# Patient Record
Sex: Female | Born: 1975 | Race: Black or African American | Hispanic: No | Marital: Married | Smoking: Never smoker
Health system: Southern US, Community
[De-identification: ages and names within clinical notes are randomized; demographics above are authoritative.]

## PROBLEM LIST (undated history)

## (undated) DIAGNOSIS — K5900 Constipation, unspecified: Secondary | ICD-10-CM

## (undated) DIAGNOSIS — K219 Gastro-esophageal reflux disease without esophagitis: Secondary | ICD-10-CM

## (undated) DIAGNOSIS — N289 Disorder of kidney and ureter, unspecified: Secondary | ICD-10-CM

## (undated) DIAGNOSIS — J45909 Unspecified asthma, uncomplicated: Secondary | ICD-10-CM

## (undated) DIAGNOSIS — R519 Headache, unspecified: Secondary | ICD-10-CM

## (undated) DIAGNOSIS — R51 Headache: Secondary | ICD-10-CM

## (undated) DIAGNOSIS — F32A Depression, unspecified: Secondary | ICD-10-CM

## (undated) DIAGNOSIS — E739 Lactose intolerance, unspecified: Secondary | ICD-10-CM

## (undated) DIAGNOSIS — I10 Essential (primary) hypertension: Secondary | ICD-10-CM

## (undated) DIAGNOSIS — G43909 Migraine, unspecified, not intractable, without status migrainosus: Secondary | ICD-10-CM

## (undated) DIAGNOSIS — F329 Major depressive disorder, single episode, unspecified: Secondary | ICD-10-CM

## (undated) DIAGNOSIS — E559 Vitamin D deficiency, unspecified: Secondary | ICD-10-CM

## (undated) DIAGNOSIS — E7800 Pure hypercholesterolemia, unspecified: Secondary | ICD-10-CM

## (undated) DIAGNOSIS — T7840XA Allergy, unspecified, initial encounter: Secondary | ICD-10-CM

## (undated) HISTORY — DX: Gastro-esophageal reflux disease without esophagitis: K21.9

## (undated) HISTORY — PX: WISDOM TOOTH EXTRACTION: SHX21

## (undated) HISTORY — DX: Constipation, unspecified: K59.00

## (undated) HISTORY — DX: Migraine, unspecified, not intractable, without status migrainosus: G43.909

## (undated) HISTORY — DX: Disorder of kidney and ureter, unspecified: N28.9

## (undated) HISTORY — DX: Lactose intolerance, unspecified: E73.9

## (undated) HISTORY — DX: Vitamin D deficiency, unspecified: E55.9

## (undated) HISTORY — DX: Unspecified asthma, uncomplicated: J45.909

## (undated) HISTORY — DX: Pure hypercholesterolemia, unspecified: E78.00

## (undated) HISTORY — DX: Major depressive disorder, single episode, unspecified: F32.9

## (undated) HISTORY — DX: Depression, unspecified: F32.A

## (undated) HISTORY — DX: Headache, unspecified: R51.9

## (undated) HISTORY — DX: Headache: R51

## (undated) HISTORY — DX: Essential (primary) hypertension: I10

## (undated) HISTORY — DX: Allergy, unspecified, initial encounter: T78.40XA

---

## 1999-04-05 ENCOUNTER — Other Ambulatory Visit: Admission: RE | Admit: 1999-04-05 | Discharge: 1999-04-05 | Payer: Self-pay | Admitting: Obstetrics and Gynecology

## 2000-04-30 ENCOUNTER — Emergency Department (HOSPITAL_COMMUNITY): Admission: EM | Admit: 2000-04-30 | Discharge: 2000-04-30 | Payer: Self-pay | Admitting: Emergency Medicine

## 2000-04-30 ENCOUNTER — Encounter: Payer: Self-pay | Admitting: Emergency Medicine

## 2002-02-09 ENCOUNTER — Other Ambulatory Visit: Admission: RE | Admit: 2002-02-09 | Discharge: 2002-02-09 | Payer: Self-pay | Admitting: Obstetrics and Gynecology

## 2002-04-06 ENCOUNTER — Emergency Department (HOSPITAL_COMMUNITY): Admission: EM | Admit: 2002-04-06 | Discharge: 2002-04-06 | Payer: Self-pay | Admitting: Emergency Medicine

## 2002-04-06 ENCOUNTER — Encounter: Payer: Self-pay | Admitting: Emergency Medicine

## 2003-06-25 ENCOUNTER — Other Ambulatory Visit: Admission: RE | Admit: 2003-06-25 | Discharge: 2003-06-25 | Payer: Self-pay | Admitting: Obstetrics and Gynecology

## 2004-09-27 ENCOUNTER — Other Ambulatory Visit: Admission: RE | Admit: 2004-09-27 | Discharge: 2004-09-27 | Payer: Self-pay | Admitting: Obstetrics and Gynecology

## 2010-12-25 ENCOUNTER — Emergency Department (HOSPITAL_COMMUNITY): Payer: 59

## 2010-12-25 ENCOUNTER — Encounter: Payer: Self-pay | Admitting: Emergency Medicine

## 2010-12-25 ENCOUNTER — Emergency Department (HOSPITAL_COMMUNITY)
Admission: EM | Admit: 2010-12-25 | Discharge: 2010-12-25 | Disposition: A | Discharge status: Home / Self Care | Payer: 59 | Attending: Emergency Medicine | Admitting: Emergency Medicine

## 2010-12-25 DIAGNOSIS — R42 Dizziness and giddiness: Secondary | ICD-10-CM | POA: Insufficient documentation

## 2010-12-25 DIAGNOSIS — S0993XA Unspecified injury of face, initial encounter: Secondary | ICD-10-CM | POA: Insufficient documentation

## 2010-12-25 DIAGNOSIS — F0781 Postconcussional syndrome: Secondary | ICD-10-CM | POA: Insufficient documentation

## 2010-12-25 DIAGNOSIS — R110 Nausea: Secondary | ICD-10-CM | POA: Insufficient documentation

## 2010-12-25 DIAGNOSIS — IMO0002 Reserved for concepts with insufficient information to code with codable children: Secondary | ICD-10-CM | POA: Insufficient documentation

## 2010-12-25 MED ORDER — ACETAMINOPHEN 325 MG PO TABS
650.0000 mg | ORAL_TABLET | Freq: Once | ORAL | Status: AC
  Administered 2010-12-25: 650 mg via ORAL
  Filled 2010-12-25: qty 2

## 2010-12-25 NOTE — ED Notes (Signed)
No neuro deficits noted at this time, grips equal bilateral, pupils PERRLA

## 2010-12-25 NOTE — ED Provider Notes (Signed)
History     CSN: [id number] Arrival date & time: 12/25/2010 10:46 AM   First MD Initiated Contact with Patient 12/25/10 1105      Chief Complaint  Patient presents with  . Head Injury    (Consider location/radiation/quality/duration/timing/severity/associated sxs/prior treatment) Patient is a 35 y.o. female presenting with head injury. The history is provided by the patient.  Head Injury  The incident occurred yesterday. She came to the ER via walk-in. The injury mechanism was a direct blow. There was no loss of consciousness. There was no blood loss. The quality of the pain is described as dull and throbbing. The pain is mild. The pain has been improving since the injury. Pertinent negatives include no numbness, no blurred vision, no vomiting, no tinnitus, no disorientation, no weakness and no memory loss.  She hit her face just above the R eye on a moving ceiling fan yesterday. She did not have any LOC but did "see stars" when the incident occurred. She had a frontal headache and slight photophobia and nausea for the rest of the day. Denies visual disturbance, trouble walking, dizziness, vomiting.  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 Did not pass out or have the sensation of the room spinning around her. She had eaten breakfast just prior to the case. Does not normally have issues with low BGs. Admits slight nausea all morning.  History reviewed. No pertinent past medical history.  History reviewed. No pertinent past surgical history.  History reviewed. No pertinent family history.  History  Substance Use Topics  . Smoking status: Never Smoker   . Smokeless tobacco: Not on file  . Alcohol Use:     OB History    Grav Para Term Preterm Abortions TAB SAB Ect Mult Living                  Review of Systems  Constitutional: Negative for fever, chills, activity change and appetite change.  HENT: Negative for nosebleeds,  facial swelling, neck pain, neck stiffness and tinnitus.   Eyes: Positive for photophobia. Negative for blurred vision, pain and visual disturbance.  Respiratory: Negative for chest tightness and shortness of breath.   Cardiovascular: Negative for chest pain and palpitations.  Gastrointestinal: Positive for nausea. Negative for vomiting and abdominal pain.  Musculoskeletal: Negative for myalgias.  Skin: Negative for wound.  Neurological: Positive for light-headedness and headaches. Negative for dizziness, weakness and numbness.  Psychiatric/Behavioral: Negative for memory loss.    Allergies  Ibuprofen  Home Medications   Current Outpatient Rx  Name Route Sig Dispense Refill  . VITAMIN C PO Oral Take 1 tablet by mouth daily.      . BUPROPION HCL ER (XL) 300 MG PO TB24 Oral Take 300 mg by mouth daily.      Marland Kitchen VITAMIN D 1000 UNITS PO TABS Oral Take 1,000 Units by mouth daily.      Gaylord Shih TRI-CYCLEN LO PO Oral Take 1 tablet by mouth daily.        BP 127/75  Pulse 80  Temp(Src) 98.1 F (36.7 C) (Oral)  Resp 16  SpO2 100%  LMP 12/20/2010  Physical Exam  Nursing note and vitals reviewed. Constitutional: She is oriented to person, place, and time. She appears well-developed and well-nourished. No distress.  HENT:  Head: Normocephalic and atraumatic.    Right Ear: Tympanic membrane and external ear normal.  Left Ear: Tympanic membrane and external ear  normal.  Nose: Nose normal.  Mouth/Throat: Oropharynx is clear and moist. No oropharyngeal exudate.       Tenderness to palp to forehead above R eye, medially. No bruising, swelling, crepitus, deformity appreciated  Eyes: Conjunctivae are normal. Pupils are equal, round, and reactive to light.  Neck: Normal range of motion. Neck supple.       Neck: no palp stepoff, crepitus, deformity; no paraspinal spasm  Cardiovascular: Normal rate, regular rhythm and normal heart sounds.   Pulmonary/Chest: Effort normal and breath sounds  normal.  Neurological: She is alert and oriented to person, place, and time. No cranial nerve deficit. She exhibits normal muscle tone. Coordination and gait normal. GCS eye subscore is 4. GCS verbal subscore is 5. GCS motor subscore is 6.  Skin: Skin is warm and dry. She is not diaphoretic.  Psychiatric: She has a normal mood and affect.    ED Course  Procedures (including critical care time)   Labs Reviewed  POCT PREGNANCY, URINE   Dg Facial Bones Complete  12/25/2010  *RADIOLOGY REPORT*  Clinical Data: Struck in face.  Tenderness above right.  FACIAL BONES COMPLETE 3+V  Comparison:  None.  Findings:  There is no evidence of fracture or other significant bone abnormality.  No orbital emphysema or sinus air-fluid levels are seen.  IMPRESSION: Negative.  Original Report Authenticated By: Elsie Stain, M.D.     1. Post concussive syndrome       MDM  X-ray unremarkable. Labs additionally unremarkable. No focal neurologic signs that would prompt ordering a head CT at this time. I believe the patient's symptoms may be postconcussive in nature. She was instructed on signs and symptoms that would prompt a return visit to the ED. She was given a note to stay out of work tomorrow. Patient verbalized understanding and agreed to plan.        Grant Fontana, [place] 12/25/10 [phone]

## 2010-12-25 NOTE — ED Notes (Signed)
Pt states got hit above right eye yesterday by ceiling fan. Reports episode of light headedness today. Reports nausea, no vomiting. Denies blurred vision. Neuro intact.

## 2010-12-25 NOTE — ED Notes (Signed)
CBG 91 

## 2010-12-25 NOTE — ED Notes (Signed)
Pt states hit head on moving ceiling fan yesterday, pt states got "light headed" today while at work.

## 2010-12-26 LAB — GLUCOSE, CAPILLARY: Glucose-Capillary: 91 mg/dL (ref 70–99)

## 2010-12-26 NOTE — ED Provider Notes (Signed)
Medical screening examination/treatment/procedure(s) were performed by non-physician practitioner and as supervising physician I was immediately available for consultation/collaboration.   Nat Christen, MD 12/26/10 [phone]

## 2011-10-12 DIAGNOSIS — F329 Major depressive disorder, single episode, unspecified: Secondary | ICD-10-CM | POA: Insufficient documentation

## 2013-02-19 DIAGNOSIS — E559 Vitamin D deficiency, unspecified: Secondary | ICD-10-CM | POA: Insufficient documentation

## 2013-12-12 DIAGNOSIS — E785 Hyperlipidemia, unspecified: Secondary | ICD-10-CM | POA: Insufficient documentation

## 2015-01-13 MED FILL — FETZIMA ER 80 MG CAPSULE: 80 | 90 days supply | Qty: 90 | Fill #1

## 2015-03-24 ENCOUNTER — Telehealth: Payer: Self-pay | Admitting: *Deleted

## 2015-03-24 NOTE — Telephone Encounter (Signed)
Pt unable to participate in pre-visit info at time of call as she is working tonight and was trying to sleep at time of call.

## 2015-03-25 ENCOUNTER — Encounter: Payer: Self-pay | Admitting: Family Medicine

## 2015-03-25 ENCOUNTER — Ambulatory Visit (INDEPENDENT_AMBULATORY_CARE_PROVIDER_SITE_OTHER): Payer: BLUE CROSS/BLUE SHIELD | Admitting: Family Medicine

## 2015-03-25 VITALS — BP 110/68 | HR 76 | Temp 98.0°F | Ht 63.0 in | Wt 179.6 lb

## 2015-03-25 DIAGNOSIS — R21 Rash and other nonspecific skin eruption: Secondary | ICD-10-CM

## 2015-03-25 DIAGNOSIS — Z0000 Encounter for general adult medical examination without abnormal findings: Secondary | ICD-10-CM | POA: Diagnosis not present

## 2015-03-25 DIAGNOSIS — F32A Depression, unspecified: Secondary | ICD-10-CM

## 2015-03-25 DIAGNOSIS — Z114 Encounter for screening for human immunodeficiency virus [HIV]: Secondary | ICD-10-CM | POA: Diagnosis not present

## 2015-03-25 DIAGNOSIS — F329 Major depressive disorder, single episode, unspecified: Secondary | ICD-10-CM

## 2015-03-25 LAB — COMPREHENSIVE METABOLIC PANEL
ALT: 12 U/L (ref 6–29)
AST: 15 U/L (ref 10–30)
Albumin: 4 g/dL (ref 3.6–5.1)
Alkaline Phosphatase: 68 U/L (ref 33–115)
BUN: 12 mg/dL (ref 7–25)
CALCIUM: 9 mg/dL (ref 8.6–10.2)
CHLORIDE: 100 mmol/L (ref 98–110)
CO2: 23 mmol/L (ref 20–31)
Creat: 0.88 mg/dL (ref 0.50–1.10)
GLUCOSE: 96 mg/dL (ref 65–99)
POTASSIUM: 3.7 mmol/L (ref 3.5–5.3)
Sodium: 135 mmol/L (ref 135–146)
Total Bilirubin: 0.6 mg/dL (ref 0.2–1.2)
Total Protein: 7.3 g/dL (ref 6.1–8.1)

## 2015-03-25 LAB — CBC WITH DIFFERENTIAL/PLATELET
Basophils Absolute: 0 10*3/uL (ref 0.0–0.1)
Basophils Relative: 0 % (ref 0–1)
Eosinophils Absolute: 0.1 10*3/uL (ref 0.0–0.7)
Eosinophils Relative: 2 % (ref 0–5)
HEMATOCRIT: 37.9 % (ref 36.0–46.0)
HEMOGLOBIN: 13.3 g/dL (ref 12.0–15.0)
LYMPHS PCT: 47 % — AB (ref 12–46)
Lymphs Abs: 2.3 10*3/uL (ref 0.7–4.0)
MCH: 30.9 pg (ref 26.0–34.0)
MCHC: 35.1 g/dL (ref 30.0–36.0)
MCV: 88.1 fL (ref 78.0–100.0)
MONO ABS: 0.7 10*3/uL (ref 0.1–1.0)
MONOS PCT: 14 % — AB (ref 3–12)
MPV: 8.9 fL (ref 8.6–12.4)
NEUTROS ABS: 1.8 10*3/uL (ref 1.7–7.7)
NEUTROS PCT: 37 % — AB (ref 43–77)
Platelets: 268 10*3/uL (ref 150–400)
RBC: 4.3 MIL/uL (ref 3.87–5.11)
RDW: 13.2 % (ref 11.5–15.5)
WBC: 4.9 10*3/uL (ref 4.0–10.5)

## 2015-03-25 LAB — LIPID PANEL
CHOL/HDL RATIO: 3.2 ratio (ref <=5.0)
CHOLESTEROL: 210 mg/dL — AB (ref 125–200)
HDL: 65 mg/dL (ref >=46)
LDL CALC: 129 mg/dL (ref <130)
TRIGLYCERIDES: 82 mg/dL (ref <150)
VLDL: 16 mg/dL (ref <30)

## 2015-03-25 LAB — TSH: TSH: 1.19 mIU/L

## 2015-03-25 MED ORDER — LORCASERIN HCL 10 MG PO TABS: ORAL_TABLET | ORAL | Status: DC

## 2015-03-25 MED ORDER — FETZIMA 80 MG PO CP24: 1.0000 | ORAL_CAPSULE | Freq: Every day | ORAL | Status: DC

## 2015-03-25 MED ORDER — PREDNISONE 10 MG PO TABS: ORAL_TABLET | ORAL | Status: DC

## 2015-03-25 MED FILL — BELVIQ 10 MG TABLET: 10 | 30 days supply | Qty: 60 | Fill #0

## 2015-03-25 MED FILL — predniSONE 10 MG TABS: 10 | 9 days supply | Qty: 18 | Fill #0

## 2015-03-25 NOTE — Progress Notes (Signed)
Subjective:     Crystal Sandoval is a 40 y.o. female and is here for a comprehensive physical exam. The patient reports no problems.  Social History   Social History  . Marital Status: Married    Spouse Name: N/A  . Number of Children: N/A  . Years of Education: N/A   Occupational History  .  [employer]    surgical tech   Social History Main Topics  . Smoking status: Never Smoker   . Smokeless tobacco: Not on file  . Alcohol Use: 0.0 oz/week    0 Standard drinks or equivalent per week     Comment: Occ  . Drug Use: Not on file  . Sexual Activity: Not on file   Other Topics Concern  . Not on file   Social History Narrative   Health Maintenance  Topic Date Due  . HIV Screening  06/01/1990  . TETANUS/TDAP  06/01/1994  . INFLUENZA VACCINE  08/09/2015  . PAP SMEAR  12/08/2017    The following portions of the patient's history were reviewed and updated as appropriate:  She  has a past medical history of Asthma; Depression; Allergy; Migraine; and Frequent headaches. She  does not have any pertinent problems on file. She  has past surgical history that includes Wisdom tooth extraction. Her family history includes Alcohol abuse in her mother. She  reports that she has never smoked. She does not have any smokeless tobacco history on file. She reports that she drinks alcohol. Her drug history is not on file. She has a current medication list which includes the following prescription(s): ascorbic acid, cholecalciferol, fetzima, and lorcaserin hcl. Current Outpatient Prescriptions on File Prior to Visit  Medication Sig Dispense Refill  . Ascorbic Acid (VITAMIN C PO) Take 1 tablet by mouth daily.      . cholecalciferol (VITAMIN D) 1000 UNITS tablet Take 1,000 Units by mouth daily.       No current facility-administered medications on file prior to visit.   She is allergic to ibuprofen..  Review of Systems Review of Systems  Constitutional: Negative for activity change,  appetite change and fatigue.  HENT: Negative for hearing loss, congestion, tinnitus and ear discharge.  dentist q29mEyes: Negative for visual disturbance (see optho q1y -- vision corrected to 20/20 with glasses).  Respiratory: Negative for cough, chest tightness and shortness of breath.   Cardiovascular: Negative for chest pain, palpitations and leg swelling.  Gastrointestinal: Negative for abdominal pain, diarrhea, constipation and abdominal distention.  Genitourinary: Negative for urgency, frequency, decreased urine volume and difficulty urinating.  Musculoskeletal: Negative for back pain, arthralgias and gait problem.  Skin: Negative for color change, pallor and rash.  Neurological: Negative for dizziness, light-headedness, numbness and headaches.  Hematological: Negative for adenopathy. Does not bruise/bleed easily.  Psychiatric/Behavioral: Negative for suicidal ideas, confusion, sleep disturbance, self-injury, dysphoric mood, decreased concentration and agitation.       Objective:    BP 110/68 mmHg  Pulse 76  Temp(Src) 98 F (36.7 C) (Oral)  Ht _0  (1.6 m)  Wt 179 lb 9.6 oz (81.466 kg)  BMI 31.82 kg/m2  SpO2 98%  LMP 02/28/2015 General appearance: alert, cooperative, appears stated age and no distress Head: Normocephalic, without obvious abnormality, atraumatic Eyes: conjunctivae/corneas clear. PERRL, EOM's intact. Fundi benign. Ears: normal TM's and external ear canals both ears Nose: Nares normal. Septum midline. Mucosa normal. No drainage or sinus tenderness. Throat: lips, mucosa, and tongue normal; teeth and gums normal Neck: no adenopathy, no carotid  bruit, no JVD, supple, symmetrical, trachea midline and thyroid not enlarged, symmetric, no tenderness/mass/nodules Back: symmetric, no curvature. ROM normal. No CVA tenderness. Lungs: clear to auscultation bilaterally Breasts: gyn Heart: regular rate and rhythm, S1, S2 normal, no murmur, click, rub or gallop Abdomen:  soft, non-tender; bowel sounds normal; no masses,  no organomegaly Pelvic: deferred--gyn Extremities: extremities normal, atraumatic, no cyanosis or edema Pulses: 2+ and symmetric Skin: Skin color, texture, turgor normal. No rashes or lesions Lymph nodes: Cervical, supraclavicular, and axillary nodes normal. Neurologic: Alert and oriented X 3, normal strength and tone. Normal symmetric reflexes. Normal coordination and gait Psych- no depression, no anxiety      Assessment:    Healthy female exam.      Plan:    ghm utd Check labs  See After Visit Summary for Counseling Recommendations   1. Preventative health care See above - Comp Met (CMET) - CBC with Differential/Platelet - Lipid panel - POCT urinalysis dipstick - TSH  2. Encounter for screening for HIV   - HIV antibody  3. Depression Stable, con't med - FETZIMA 80 MG CP24; Take 1 tablet by mouth daily.  Dispense: 90 capsule; Refill: 3

## 2015-03-25 NOTE — Patient Instructions (Signed)
Preventive Care for Adults, Female A healthy lifestyle and preventive care can promote health and wellness. Preventive health guidelines for women include the following key practices.  A routine yearly physical is a good way to check with your health care provider about your health and preventive screening. It is a chance to share any concerns and updates on your health and to receive a thorough exam.  Visit your dentist for a routine exam and preventive care every 6 months. Brush your teeth twice a day and floss once a day. Good oral hygiene prevents tooth decay and gum disease.  The frequency of eye exams is based on your age, health, family medical history, use of contact lenses, and other factors. Follow your health care provider's recommendations for frequency of eye exams.  Eat a healthy diet. Foods like vegetables, fruits, whole grains, low-fat dairy products, and lean protein foods contain the nutrients you need without too many calories. Decrease your intake of foods high in solid fats, added sugars, and salt. Eat the right amount of calories for you.Get information about a proper diet from your health care provider, if necessary.  Regular physical exercise is one of the most important things you can do for your health. Most adults should get at least 150 minutes of moderate-intensity exercise (any activity that increases your heart rate and causes you to sweat) each week. In addition, most adults need muscle-strengthening exercises on 2 or more days a week.  Maintain a healthy weight. The body mass index (BMI) is a screening tool to identify possible weight problems. It provides an estimate of body fat based on height and weight. Your health care provider can find your BMI and can help you achieve or maintain a healthy weight.For adults 20 years and older:  A BMI below 18.5 is considered underweight.  A BMI of 18.5 to 24.9 is normal.  A BMI of 25 to 29.9 is considered overweight.  A  BMI of 30 and above is considered obese.  Maintain normal blood lipids and cholesterol levels by exercising and minimizing your intake of saturated fat. Eat a balanced diet with plenty of fruit and vegetables. Blood tests for lipids and cholesterol should begin at age 45 and be repeated every 5 years. If your lipid or cholesterol levels are high, you are over 50, or you are at high risk for heart disease, you may need your cholesterol levels checked more frequently.Ongoing high lipid and cholesterol levels should be treated with medicines if diet and exercise are not working.  If you smoke, find out from your health care provider how to quit. If you do not use tobacco, do not start.  Lung cancer screening is recommended for adults aged 45-80 years who are at high risk for developing lung cancer because of a history of smoking. A yearly low-dose CT scan of the lungs is recommended for people who have at least a 30-pack-year history of smoking and are a current smoker or have quit within the past 15 years. A pack year of smoking is smoking an average of 1 pack of cigarettes a day for 1 year (for example: 1 pack a day for 30 years or 2 packs a day for 15 years). Yearly screening should continue until the smoker has stopped smoking for at least 15 years. Yearly screening should be stopped for people who develop a health problem that would prevent them from having lung cancer treatment.  If you are pregnant, do not drink alcohol. If you are  breastfeeding, be very cautious about drinking alcohol. If you are not pregnant and choose to drink alcohol, do not have more than 1 drink per day. One drink is considered to be 12 ounces (355 mL) of beer, 5 ounces (148 mL) of wine, or 1.5 ounces (44 mL) of liquor.  Avoid use of street drugs. Do not share needles with anyone. Ask for help if you need support or instructions about stopping the use of drugs.  High blood pressure causes heart disease and increases the risk  of stroke. Your blood pressure should be checked at least every 1 to 2 years. Ongoing high blood pressure should be treated with medicines if weight loss and exercise do not work.  If you are 55-79 years old, ask your health care provider if you should take aspirin to prevent strokes.  Diabetes screening is done by taking a blood sample to check your blood glucose level after you have not eaten for a certain period of time (fasting). If you are not overweight and you do not have risk factors for diabetes, you should be screened once every 3 years starting at age 45. If you are overweight or obese and you are 40-70 years of age, you should be screened for diabetes every year as part of your cardiovascular risk assessment.  Breast cancer screening is essential preventive care for women. You should practice "breast self-awareness." This means understanding the normal appearance and feel of your breasts and may include breast self-examination. Any changes detected, no matter how small, should be reported to a health care provider. Women in their 20s and 30s should have a clinical breast exam (CBE) by a health care provider as part of a regular health exam every 1 to 3 years. After age 40, women should have a CBE every year. Starting at age 40, women should consider having a mammogram (breast X-ray test) every year. Women who have a family history of breast cancer should talk to their health care provider about genetic screening. Women at a high risk of breast cancer should talk to their health care providers about having an MRI and a mammogram every year.  Breast cancer gene (BRCA)-related cancer risk assessment is recommended for women who have family members with BRCA-related cancers. BRCA-related cancers include breast, ovarian, tubal, and peritoneal cancers. Having family members with these cancers may be associated with an increased risk for harmful changes (mutations) in the breast cancer genes BRCA1 and  BRCA2. Results of the assessment will determine the need for genetic counseling and BRCA1 and BRCA2 testing.  Your health care provider may recommend that you be screened regularly for cancer of the pelvic organs (ovaries, uterus, and vagina). This screening involves a pelvic examination, including checking for microscopic changes to the surface of your cervix (Pap test). You may be encouraged to have this screening done every 3 years, beginning at age 21.  For women ages 30-65, health care providers may recommend pelvic exams and Pap testing every 3 years, or they may recommend the Pap and pelvic exam, combined with testing for human papilloma virus (HPV), every 5 years. Some types of HPV increase your risk of cervical cancer. Testing for HPV may also be done on women of any age with unclear Pap test results.  Other health care providers may not recommend any screening for nonpregnant women who are considered low risk for pelvic cancer and who do not have symptoms. Ask your health care provider if a screening pelvic exam is right for   you.  If you have had past treatment for cervical cancer or a condition that could lead to cancer, you need Pap tests and screening for cancer for at least 20 years after your treatment. If Pap tests have been discontinued, your risk factors (such as having a new sexual partner) need to be reassessed to determine if screening should resume. Some women have medical problems that increase the chance of getting cervical cancer. In these cases, your health care provider may recommend more frequent screening and Pap tests.  Colorectal cancer can be detected and often prevented. Most routine colorectal cancer screening begins at the age of 50 years and continues through age 75 years. However, your health care provider may recommend screening at an earlier age if you have risk factors for colon cancer. On a yearly basis, your health care provider may provide home test kits to check  for hidden blood in the stool. Use of a small camera at the end of a tube, to directly examine the colon (sigmoidoscopy or colonoscopy), can detect the earliest forms of colorectal cancer. Talk to your health care provider about this at age 50, when routine screening begins. Direct exam of the colon should be repeated every 5-10 years through age 75 years, unless early forms of precancerous polyps or small growths are found.  People who are at an increased risk for hepatitis B should be screened for this virus. You are considered at high risk for hepatitis B if:  You were born in a country where hepatitis B occurs often. Talk with your health care provider about which countries are considered high risk.  Your parents were born in a high-risk country and you have not received a shot to protect against hepatitis B (hepatitis B vaccine).  You have HIV or AIDS.  You use needles to inject street drugs.  You live with, or have sex with, someone who has hepatitis B.  You get hemodialysis treatment.  You take certain medicines for conditions like cancer, organ transplantation, and autoimmune conditions.  Hepatitis C blood testing is recommended for all people born from 1945 through 1965 and any individual with known risks for hepatitis C.  Practice safe sex. Use condoms and avoid high-risk sexual practices to reduce the spread of sexually transmitted infections (STIs). STIs include gonorrhea, chlamydia, syphilis, trichomonas, herpes, HPV, and human immunodeficiency virus (HIV). Herpes, HIV, and HPV are viral illnesses that have no cure. They can result in disability, cancer, and death.  You should be screened for sexually transmitted illnesses (STIs) including gonorrhea and chlamydia if:  You are sexually active and are younger than 24 years.  You are older than 24 years and your health care provider tells you that you are at risk for this type of infection.  Your sexual activity has changed  since you were last screened and you are at an increased risk for chlamydia or gonorrhea. Ask your health care provider if you are at risk.  If you are at risk of being infected with HIV, it is recommended that you take a prescription medicine daily to prevent HIV infection. This is called preexposure prophylaxis (PrEP). You are considered at risk if:  You are sexually active and do not regularly use condoms or know the HIV status of your partner(s).  You take drugs by injection.  You are sexually active with a partner who has HIV.  Talk with your health care provider about whether you are at high risk of being infected with HIV. If   you choose to begin PrEP, you should first be tested for HIV. You should then be tested every 3 months for as long as you are taking PrEP.  Osteoporosis is a disease in which the bones lose minerals and strength with aging. This can result in serious bone fractures or breaks. The risk of osteoporosis can be identified using a bone density scan. Women ages 82 years and over and women at risk for fractures or osteoporosis should discuss screening with their health care providers. Ask your health care provider whether you should take a calcium supplement or vitamin D to reduce the rate of osteoporosis.  Menopause can be associated with physical symptoms and risks. Hormone replacement therapy is available to decrease symptoms and risks. You should talk to your health care provider about whether hormone replacement therapy is right for you.  Use sunscreen. Apply sunscreen liberally and repeatedly throughout the day. You should seek shade when your shadow is shorter than you. Protect yourself by wearing long sleeves, pants, a wide-brimmed hat, and sunglasses year round, whenever you are outdoors.  Once a month, do a whole body skin exam, using a mirror to look at the skin on your back. Tell your health care provider of new moles, moles that have irregular borders, moles that  are larger than a pencil eraser, or moles that have changed in shape or color.  Stay current with required vaccines (immunizations).  Influenza vaccine. All adults should be immunized every year.  Tetanus, diphtheria, and acellular pertussis (Td, Tdap) vaccine. Pregnant women should receive 1 dose of Tdap vaccine during each pregnancy. The dose should be obtained regardless of the length of time since the last dose. Immunization is preferred during the 27th-36th week of gestation. An adult who has not previously received Tdap or who does not know her vaccine status should receive 1 dose of Tdap. This initial dose should be followed by tetanus and diphtheria toxoids (Td) booster doses every 10 years. Adults with an unknown or incomplete history of completing a 3-dose immunization series with Td-containing vaccines should begin or complete a primary immunization series including a Tdap dose. Adults should receive a Td booster every 10 years.  Varicella vaccine. An adult without evidence of immunity to varicella should receive 2 doses or a second dose if she has previously received 1 dose. Pregnant females who do not have evidence of immunity should receive the first dose after pregnancy. This first dose should be obtained before leaving the health care facility. The second dose should be obtained 4-8 weeks after the first dose.  Human papillomavirus (HPV) vaccine. Females aged 13-26 years who have not received the vaccine previously should obtain the 3-dose series. The vaccine is not recommended for use in pregnant females. However, pregnancy testing is not needed before receiving a dose. If a female is found to be pregnant after receiving a dose, no treatment is needed. In that case, the remaining doses should be delayed until after the pregnancy. Immunization is recommended for any person with an immunocompromised condition through the age of 59 years if she did not get any or all doses earlier. During the  3-dose series, the second dose should be obtained 4-8 weeks after the first dose. The third dose should be obtained 24 weeks after the first dose and 16 weeks after the second dose.  Zoster vaccine. One dose is recommended for adults aged 50 years or older unless certain conditions are present.  Measles, mumps, and rubella (MMR) vaccine. Adults born  before 1957 generally are considered immune to measles and mumps. Adults born in 1957 or later should have 1 or more doses of MMR vaccine unless there is a contraindication to the vaccine or there is laboratory evidence of immunity to each of the three diseases. A routine second dose of MMR vaccine should be obtained at least 28 days after the first dose for students attending postsecondary schools, health care workers, or international travelers. People who received inactivated measles vaccine or an unknown type of measles vaccine during 1963-1967 should receive 2 doses of MMR vaccine. People who received inactivated mumps vaccine or an unknown type of mumps vaccine before 1979 and are at high risk for mumps infection should consider immunization with 2 doses of MMR vaccine. For females of childbearing age, rubella immunity should be determined. If there is no evidence of immunity, females who are not pregnant should be vaccinated. If there is no evidence of immunity, females who are pregnant should delay immunization until after pregnancy. Unvaccinated health care workers born before 1957 who lack laboratory evidence of measles, mumps, or rubella immunity or laboratory confirmation of disease should consider measles and mumps immunization with 2 doses of MMR vaccine or rubella immunization with 1 dose of MMR vaccine.  Pneumococcal 13-valent conjugate (PCV13) vaccine. When indicated, a person who is uncertain of his immunization history and has no record of immunization should receive the PCV13 vaccine. All adults 65 years of age and older should receive this  vaccine. An adult aged 19 years or older who has certain medical conditions and has not been previously immunized should receive 1 dose of PCV13 vaccine. This PCV13 should be followed with a dose of pneumococcal polysaccharide (PPSV23) vaccine. Adults who are at high risk for pneumococcal disease should obtain the PPSV23 vaccine at least 8 weeks after the dose of PCV13 vaccine. Adults older than 40 years of age who have normal immune system function should obtain the PPSV23 vaccine dose at least 1 year after the dose of PCV13 vaccine.  Pneumococcal polysaccharide (PPSV23) vaccine. When PCV13 is also indicated, PCV13 should be obtained first. All adults aged 65 years and older should be immunized. An adult younger than age 65 years who has certain medical conditions should be immunized. Any person who resides in a nursing home or long-term care facility should be immunized. An adult smoker should be immunized. People with an immunocompromised condition and certain other conditions should receive both PCV13 and PPSV23 vaccines. People with human immunodeficiency virus (HIV) infection should be immunized as soon as possible after diagnosis. Immunization during chemotherapy or radiation therapy should be avoided. Routine use of PPSV23 vaccine is not recommended for American Indians, [place] Natives, or people younger than 65 years unless there are medical conditions that require PPSV23 vaccine. When indicated, people who have unknown immunization and have no record of immunization should receive PPSV23 vaccine. One-time revaccination 5 years after the first dose of PPSV23 is recommended for people aged 19-64 years who have chronic kidney failure, nephrotic syndrome, asplenia, or immunocompromised conditions. People who received 1-2 doses of PPSV23 before age 65 years should receive another dose of PPSV23 vaccine at age 65 years or later if at least 5 years have passed since the previous dose. Doses of PPSV23 are not  needed for people immunized with PPSV23 at or after age 65 years.  Meningococcal vaccine. Adults with asplenia or persistent complement component deficiencies should receive 2 doses of quadrivalent meningococcal conjugate (MenACWY-D) vaccine. The doses should be obtained   at least 2 months apart. Microbiologists working with certain meningococcal bacteria, [place] recruits, people at risk during an outbreak, and people who travel to or live in countries with a high rate of meningitis should be immunized. A first-year college student up through age 23 years who is living in a residence hall should receive a dose if she did not receive a dose on or after her 16th birthday. Adults who have certain high-risk conditions should receive one or more doses of vaccine.  Hepatitis A vaccine. Adults who wish to be protected from this disease, have certain high-risk conditions, work with hepatitis A-infected animals, work in hepatitis A research labs, or travel to or work in countries with a high rate of hepatitis A should be immunized. Adults who were previously unvaccinated and who anticipate close contact with an international adoptee during the first 60 days after arrival in the [place] from a country with a high rate of hepatitis A should be immunized.  Hepatitis B vaccine. Adults who wish to be protected from this disease, have certain high-risk conditions, may be exposed to blood or other infectious body fluids, are household contacts or sex partners of hepatitis B positive people, are clients or workers in certain care facilities, or travel to or work in countries with a high rate of hepatitis B should be immunized.  Haemophilus influenzae type b (Hib) vaccine. A previously unvaccinated person with asplenia or sickle cell disease or having a scheduled splenectomy should receive 1 dose of Hib vaccine. Regardless of previous immunization, a recipient of a hematopoietic stem cell transplant should receive a  3-dose series 6-12 months after her successful transplant. Hib vaccine is not recommended for adults with HIV infection. Preventive Services / Frequency Ages 34 to 40 years  Blood pressure check.** / Every 3-5 years.  Lipid and cholesterol check.** / Every 5 years beginning at age 8.  Clinical breast exam.** / Every 3 years for women in their 42s and 49s.  BRCA-related cancer risk assessment.** / For women who have family members with a BRCA-related cancer (breast, ovarian, tubal, or peritoneal cancers).  Pap test.** / Every 2 years from ages 65 through 54. Every 3 years starting at age 42 through age 14 or 57 with a history of 3 consecutive normal Pap tests.  HPV screening.** / Every 3 years from ages 17 through ages 16 to 25 with a history of 3 consecutive normal Pap tests.  Hepatitis C blood test.** / For any individual with known risks for hepatitis C.  Skin self-exam. / Monthly.  Influenza vaccine. / Every year.  Tetanus, diphtheria, and acellular pertussis (Tdap, Td) vaccine.** / Consult your health care provider. Pregnant women should receive 1 dose of Tdap vaccine during each pregnancy. 1 dose of Td every 10 years.  Varicella vaccine.** / Consult your health care provider. Pregnant females who do not have evidence of immunity should receive the first dose after pregnancy.  HPV vaccine. / 3 doses over 6 months, if 32 and younger. The vaccine is not recommended for use in pregnant females. However, pregnancy testing is not needed before receiving a dose.  Measles, mumps, rubella (MMR) vaccine.** / You need at least 1 dose of MMR if you were born in 1957 or later. You may also need a 2nd dose. For females of childbearing age, rubella immunity should be determined. If there is no evidence of immunity, females who are not pregnant should be vaccinated. If there is no evidence of immunity, females who are  pregnant should delay immunization until after pregnancy.  Pneumococcal  13-valent conjugate (PCV13) vaccine.** / Consult your health care provider.  Pneumococcal polysaccharide (PPSV23) vaccine.** / 1 to 2 doses if you smoke cigarettes or if you have certain conditions.  Meningococcal vaccine.** / 1 dose if you are age 41 to 24 years and a Market researcher living in a residence hall, or have one of several medical conditions, you need to get vaccinated against meningococcal disease. You may also need additional booster doses.  Hepatitis A vaccine.** / Consult your health care provider.  Hepatitis B vaccine.** / Consult your health care provider.  Haemophilus influenzae type b (Hib) vaccine.** / Consult your health care provider. Ages 69 to 45 years  Blood pressure check.** / Every year.  Lipid and cholesterol check.** / Every 5 years beginning at age 59 years.  Lung cancer screening. / Every year if you are aged 70-80 years and have a 30-pack-year history of smoking and currently smoke or have quit within the past 15 years. Yearly screening is stopped once you have quit smoking for at least 15 years or develop a health problem that would prevent you from having lung cancer treatment.  Clinical breast exam.** / Every year after age 6 years.  BRCA-related cancer risk assessment.** / For women who have family members with a BRCA-related cancer (breast, ovarian, tubal, or peritoneal cancers).  Mammogram.** / Every year beginning at age 75 years and continuing for as long as you are in good health. Consult with your health care provider.  Pap test.** / Every 3 years starting at age 69 years through age 13 or 87 years with a history of 3 consecutive normal Pap tests.  HPV screening.** / Every 3 years from ages 37 years through ages 52 to 18 years with a history of 3 consecutive normal Pap tests.  Fecal occult blood test (FOBT) of stool. / Every year beginning at age 66 years and continuing until age 31 years. You may not need to do this test if you get  a colonoscopy every 10 years.  Flexible sigmoidoscopy or colonoscopy.** / Every 5 years for a flexible sigmoidoscopy or every 10 years for a colonoscopy beginning at age 61 years and continuing until age 51 years.  Hepatitis C blood test.** / For all people born from [birth year] through 1965 and any individual with known risks for hepatitis C.  Skin self-exam. / Monthly.  Influenza vaccine. / Every year.  Tetanus, diphtheria, and acellular pertussis (Tdap/Td) vaccine.** / Consult your health care provider. Pregnant women should receive 1 dose of Tdap vaccine during each pregnancy. 1 dose of Td every 10 years.  Varicella vaccine.** / Consult your health care provider. Pregnant females who do not have evidence of immunity should receive the first dose after pregnancy.  Zoster vaccine.** / 1 dose for adults aged 79 years or older.  Measles, mumps, rubella (MMR) vaccine.** / You need at least 1 dose of MMR if you were born in 1957 or later. You may also need a second dose. For females of childbearing age, rubella immunity should be determined. If there is no evidence of immunity, females who are not pregnant should be vaccinated. If there is no evidence of immunity, females who are pregnant should delay immunization until after pregnancy.  Pneumococcal 13-valent conjugate (PCV13) vaccine.** / Consult your health care provider.  Pneumococcal polysaccharide (PPSV23) vaccine.** / 1 to 2 doses if you smoke cigarettes or if you have certain conditions.  Meningococcal vaccine.** /  Consult your health care provider.  Hepatitis A vaccine.** / Consult your health care provider.  Hepatitis B vaccine.** / Consult your health care provider.  Haemophilus influenzae type b (Hib) vaccine.** / Consult your health care provider. Ages 64 years and over  Blood pressure check.** / Every year.  Lipid and cholesterol check.** / Every 5 years beginning at age 23 years.  Lung cancer screening. / Every year if you  are aged 16-80 years and have a 30-pack-year history of smoking and currently smoke or have quit within the past 15 years. Yearly screening is stopped once you have quit smoking for at least 15 years or develop a health problem that would prevent you from having lung cancer treatment.  Clinical breast exam.** / Every year after age 74 years.  BRCA-related cancer risk assessment.** / For women who have family members with a BRCA-related cancer (breast, ovarian, tubal, or peritoneal cancers).  Mammogram.** / Every year beginning at age 44 years and continuing for as long as you are in good health. Consult with your health care provider.  Pap test.** / Every 3 years starting at age 58 years through age 22 or 39 years with 3 consecutive normal Pap tests. Testing can be stopped between 65 and 70 years with 3 consecutive normal Pap tests and no abnormal Pap or HPV tests in the past 10 years.  HPV screening.** / Every 3 years from ages 64 years through ages 70 or 61 years with a history of 3 consecutive normal Pap tests. Testing can be stopped between 65 and 70 years with 3 consecutive normal Pap tests and no abnormal Pap or HPV tests in the past 10 years.  Fecal occult blood test (FOBT) of stool. / Every year beginning at age 40 years and continuing until age 27 years. You may not need to do this test if you get a colonoscopy every 10 years.  Flexible sigmoidoscopy or colonoscopy.** / Every 5 years for a flexible sigmoidoscopy or every 10 years for a colonoscopy beginning at age 7 years and continuing until age 32 years.  Hepatitis C blood test.** / For all people born from [birth year] through 1965 and any individual with known risks for hepatitis C.  Osteoporosis screening.** / A one-time screening for women ages 30 years and over and women at risk for fractures or osteoporosis.  Skin self-exam. / Monthly.  Influenza vaccine. / Every year.  Tetanus, diphtheria, and acellular pertussis (Tdap/Td)  vaccine.** / 1 dose of Td every 10 years.  Varicella vaccine.** / Consult your health care provider.  Zoster vaccine.** / 1 dose for adults aged 35 years or older.  Pneumococcal 13-valent conjugate (PCV13) vaccine.** / Consult your health care provider.  Pneumococcal polysaccharide (PPSV23) vaccine.** / 1 dose for all adults aged 46 years and older.  Meningococcal vaccine.** / Consult your health care provider.  Hepatitis A vaccine.** / Consult your health care provider.  Hepatitis B vaccine.** / Consult your health care provider.  Haemophilus influenzae type b (Hib) vaccine.** / Consult your health care provider. ** Family history and personal history of risk and conditions may change your health care provider's recommendations.   This information is not intended to replace advice given to you by your health care provider. Make sure you discuss any questions you have with your health care provider.   Document Released: 02/20/2001 Document Revised: 01/15/2014 Document Reviewed: 05/22/2010 Elsevier Interactive Patient Education [employer].

## 2015-03-25 NOTE — Progress Notes (Signed)
Pre visit review using our clinic review tool, if applicable. No additional management support is needed unless otherwise documented below in the visit note. 

## 2015-03-26 LAB — HIV ANTIBODY (ROUTINE TESTING W REFLEX): HIV: NONREACTIVE

## 2015-04-01 MED FILL — FETZIMA ER 80 MG CAPSULE: 80 | 90 days supply | Qty: 90 | Fill #0

## 2015-04-21 ENCOUNTER — Telehealth: Payer: Self-pay | Admitting: Family Medicine

## 2015-04-21 ENCOUNTER — Encounter: Payer: Self-pay | Admitting: Family Medicine

## 2015-04-21 NOTE — Telephone Encounter (Signed)
Attempted to contact patient to reschedule appointment with Dr. Zola ButtonLowne-Chase on July 14, 2015. Left message on voicemail, letter mailed and appointment cancelled 04/21/2015

## 2015-04-22 MED FILL — BELVIQ 10 MG TABLET: 10 | 30 days supply | Qty: 60 | Fill #1

## 2015-07-08 MED FILL — BELVIQ 10 MG TABLET: 10 | 30 days supply | Qty: 60 | Fill #2

## 2015-07-14 ENCOUNTER — Ambulatory Visit: Payer: BLUE CROSS/BLUE SHIELD | Admitting: Family Medicine

## 2015-07-18 ENCOUNTER — Ambulatory Visit (INDEPENDENT_AMBULATORY_CARE_PROVIDER_SITE_OTHER): Payer: BLUE CROSS/BLUE SHIELD | Admitting: Family Medicine

## 2015-07-18 ENCOUNTER — Encounter: Payer: Self-pay | Admitting: Family Medicine

## 2015-07-18 DIAGNOSIS — E669 Obesity, unspecified: Secondary | ICD-10-CM | POA: Insufficient documentation

## 2015-07-18 DIAGNOSIS — Z1159 Encounter for screening for other viral diseases: Secondary | ICD-10-CM

## 2015-07-18 LAB — HEPATITIS B SURFACE ANTIBODY,QUALITATIVE: HEP B S AB: POSITIVE — AB

## 2015-07-18 MED ORDER — LORCASERIN HCL ER 20 MG PO TB24: 1.0000 | ORAL_TABLET | Freq: Every day | ORAL | Status: DC

## 2015-07-18 MED FILL — BELVIQ XR 20 MG TABLET: 20 | 30 days supply | Qty: 30 | Fill #0

## 2015-07-18 NOTE — Progress Notes (Signed)
Pre visit review using our clinic review tool, if applicable. No additional management support is needed unless otherwise documented below in the visit note. 

## 2015-07-18 NOTE — Assessment & Plan Note (Signed)
con't diet and exercise belviq xr 20 qd  Call or rto 3 month

## 2015-07-18 NOTE — Patient Instructions (Signed)
Obesity Obesity is defined as having too much total body fat and a body mass index (BMI) of 30 or more. BMI is an estimate of body fat and is calculated from your height and weight. BMI is typically calculated by your health care provider during regular wellness visits. Obesity happens when you consume more calories than you can burn by exercising or performing daily physical tasks. Prolonged obesity can cause major illnesses or emergencies, such as:  Stroke.  Heart disease.  Diabetes.  Cancer.  Arthritis.  High blood pressure (hypertension).  High cholesterol.  Sleep apnea.  Erectile dysfunction.  Infertility problems. CAUSES   Regularly eating unhealthy foods.  Physical inactivity.  Certain disorders, such as an underactive thyroid (hypothyroidism), Cushing's syndrome, and polycystic ovarian syndrome.  Certain medicines, such as steroids, some depression medicines, and antipsychotics.  Genetics.  Lack of sleep. DIAGNOSIS A health care provider can diagnose obesity after calculating your BMI. Obesity will be diagnosed if your BMI is 30 or higher. There are other methods of measuring obesity levels. Some other methods include measuring your skinfold thickness, your waist circumference, and comparing your hip circumference to your waist circumference. TREATMENT  A healthy treatment program includes some or all of the following:  Long-term dietary changes.  Exercise and physical activity.  Behavioral and lifestyle changes.  Medicine only under the supervision of your health care provider. Medicines may help, but only if they are used with diet and exercise programs. If your BMI is 40 or higher, your health care provider may recommend specialized surgery or programs to help with weight loss. An unhealthy treatment program includes:  Fasting.  Fad diets.  Supplements and drugs. These choices do not succeed in long-term weight control. HOME CARE  INSTRUCTIONS  Exercise and perform physical activity as directed by your health care provider. To increase physical activity, try the following:  Use stairs instead of elevators.  Park farther away from store entrances.  Garden, bike, or walk instead of watching television or using the computer.  Eat healthy, low-calorie foods and drinks on a regular basis. Eat more fruits and vegetables. Use low-calorie cookbooks or take healthy cooking classes.  Limit fast food, sweets, and processed snack foods.  Eat smaller portions.  Keep a daily journal of everything you eat. There are many free websites to help you with this. It may be helpful to measure your foods so you can determine if you are eating the correct portion sizes.  Avoid drinking alcohol. Drink more water and drinks without calories.  Take vitamins and supplements only as recommended by your health care provider.  Weight-loss support groups, registered dietitians, counselors, and stress reduction education can also be very helpful. SEEK IMMEDIATE MEDICAL CARE IF:  You have chest pain or tightness.  You have trouble breathing or feel short of breath.  You have weakness or leg numbness.  You feel confused or have trouble talking.  You have sudden changes in your vision.   This information is not intended to replace advice given to you by your health care provider. Make sure you discuss any questions you have with your health care provider.   Document Released: 02/02/2004 Document Revised: 01/15/2014 Document Reviewed: 01/31/2011 Elsevier Interactive Patient Education 2016 Elsevier Inc.  

## 2015-07-18 NOTE — Progress Notes (Signed)
Subjective:    Patient ID: Crystal Sandoval, female    DOB: 1975-06-13, 40 y.o.   MRN: [id number]  Chief Complaint  Patient presents with  . Weight Check    follow up weight loss    HPI Patient is in today for f/u belviq.  Pt is forgetting to take the second dose.  She is also needing a hep b titre done.    Past Medical History  Diagnosis Date  . Asthma   . Depression   . Allergy   . Migraine   . Frequent headaches     Past Surgical History  Procedure Laterality Date  . Wisdom tooth extraction      Family History  Problem Relation Age of Onset  . Alcohol abuse Mother     Social History   Social History  . Marital Status: Married    Spouse Name: N/A  . Number of Children: N/A  . Years of Education: N/A   Occupational History  .  [employer]    surgical tech   Social History Main Topics  . Smoking status: Never Smoker   . Smokeless tobacco: Not on file  . Alcohol Use: 0.0 oz/week    0 Standard drinks or equivalent per week     Comment: Occ  . Drug Use: Not on file  . Sexual Activity: Not on file   Other Topics Concern  . Not on file   Social History Narrative    Outpatient Prescriptions Prior to Visit  Medication Sig Dispense Refill  . Ascorbic Acid (VITAMIN C PO) Take 1 tablet by mouth daily.      . cholecalciferol (VITAMIN D) 1000 UNITS tablet Take 1,000 Units by mouth daily.      Marland Kitchen FETZIMA 80 MG CP24 Take 1 tablet by mouth daily. 90 capsule 3  . Lorcaserin HCl (BELVIQ) 10 MG TABS 1 po bid 60 tablet 2   No facility-administered medications prior to visit.    Allergies  Allergen Reactions  . Ibuprofen     Elevated kidney function    Review of Systems  Constitutional: Negative for fever, chills and malaise/fatigue.  HENT: Negative for congestion and hearing loss.   Eyes: Negative for discharge.  Respiratory: Negative for cough, sputum production and shortness of breath.   Cardiovascular: Negative for chest pain, palpitations and leg  swelling.  Gastrointestinal: Negative for heartburn, nausea, vomiting, abdominal pain, diarrhea, constipation and blood in stool.  Genitourinary: Negative for dysuria, urgency, frequency and hematuria.  Musculoskeletal: Negative for myalgias, back pain and falls.  Skin: Negative for rash.  Neurological: Negative for dizziness, sensory change, loss of consciousness, weakness and headaches.  Endo/Heme/Allergies: Negative for environmental allergies. Does not bruise/bleed easily.  Psychiatric/Behavioral: Negative for depression and suicidal ideas. The patient is not nervous/anxious and does not have insomnia.        Objective:    Physical Exam  Constitutional: She is oriented to person, place, and time. She appears well-developed and well-nourished.  HENT:  Head: Normocephalic and atraumatic.  Eyes: Conjunctivae and EOM are normal.  Neck: Normal range of motion. Neck supple. No JVD present. Carotid bruit is not present. No thyromegaly present.  Cardiovascular: Normal rate, regular rhythm and normal heart sounds.   No murmur heard. Pulmonary/Chest: Effort normal and breath sounds normal. No respiratory distress. She has no wheezes. She has no rales. She exhibits no tenderness.  Musculoskeletal: She exhibits no edema.  Neurological: She is alert and oriented to person, place, and time.  Psychiatric:  She has a normal mood and affect. Her behavior is normal. Judgment and thought content normal.  Nursing note and vitals reviewed.   BP 118/70 mmHg  Pulse 86  Temp(Src) 98.1 F (36.7 C) (Oral)  Wt 178 lb 6.4 oz (80.922 kg)  SpO2 98%  LMP 07/16/2015 Wt Readings from Last 3 Encounters:  07/18/15 178 lb 6.4 oz (80.922 kg)  03/25/15 179 lb 9.6 oz (81.466 kg)     Lab Results  Component Value Date   WBC 4.9 03/25/2015   HGB 13.3 03/25/2015   HCT 37.9 03/25/2015   PLT 268 03/25/2015   GLUCOSE 96 03/25/2015   CHOL 210* 03/25/2015   TRIG 82 03/25/2015   HDL 65 03/25/2015   LDLCALC 129  03/25/2015   ALT 12 03/25/2015   AST 15 03/25/2015   NA 135 03/25/2015   K 3.7 03/25/2015   CL 100 03/25/2015   CREATININE 0.88 03/25/2015   BUN 12 03/25/2015   CO2 23 03/25/2015   TSH 1.19 03/25/2015    Lab Results  Component Value Date   TSH 1.19 03/25/2015   Lab Results  Component Value Date   WBC 4.9 03/25/2015   HGB 13.3 03/25/2015   HCT 37.9 03/25/2015   MCV 88.1 03/25/2015   PLT 268 03/25/2015   Lab Results  Component Value Date   NA 135 03/25/2015   K 3.7 03/25/2015   CO2 23 03/25/2015   GLUCOSE 96 03/25/2015   BUN 12 03/25/2015   CREATININE 0.88 03/25/2015   BILITOT 0.6 03/25/2015   ALKPHOS 68 03/25/2015   AST 15 03/25/2015   ALT 12 03/25/2015   PROT 7.3 03/25/2015   ALBUMIN 4.0 03/25/2015   CALCIUM 9.0 03/25/2015   Lab Results  Component Value Date   CHOL 210* 03/25/2015   Lab Results  Component Value Date   HDL 65 03/25/2015   Lab Results  Component Value Date   LDLCALC 129 03/25/2015   Lab Results  Component Value Date   TRIG 82 03/25/2015   Lab Results  Component Value Date   CHOLHDL 3.2 03/25/2015   No results found for: HGBA1C     Assessment & Plan:   Problem List Items Addressed This Visit      Unprioritized   Obesity (BMI 30.0-34.9)    con't diet and exercise belviq xr 20 qd  Call or rto 3 month      Relevant Medications   Lorcaserin HCl ER (BELVIQ XR) 20 MG TB24    Other Visit Diagnoses    Morbid obesity due to excess calories (HCC)    -  Primary    Relevant Medications    Lorcaserin HCl ER (BELVIQ XR) 20 MG TB24    Need for hepatitis B screening test        Relevant Orders    Hepatitis B surface antibody       I have discontinued Ms. Koudelka's Lorcaserin HCl. I am also having her start on Lorcaserin HCl ER. Additionally, I am having her maintain her Ascorbic Acid (VITAMIN C PO), cholecalciferol, and FETZIMA.  Meds ordered this encounter  Medications  . Lorcaserin HCl ER (BELVIQ XR) 20 MG TB24    Sig: Take 1  tablet by mouth daily.    Dispense:  30 tablet    Refill:  2     Donato SchultzYvonne R Lowne Chase, DO

## 2015-09-02 ENCOUNTER — Telehealth: Payer: Self-pay | Admitting: Family Medicine

## 2015-09-02 ENCOUNTER — Encounter: Payer: Self-pay | Admitting: Family Medicine

## 2015-09-02 MED FILL — BELVIQ XR 20 MG TABLET: 20 | 30 days supply | Qty: 30 | Fill #1

## 2015-09-02 NOTE — Telephone Encounter (Signed)
Mailed letter to patient informing of the cancellation of her October 18, 2015 appointment with Dr. Zola ButtonLowne-Chase. Instructed patient to call and reschedule.

## 2015-09-21 DIAGNOSIS — Z1231 Encounter for screening mammogram for malignant neoplasm of breast: Secondary | ICD-10-CM | POA: Diagnosis not present

## 2015-09-21 DIAGNOSIS — Z6833 Body mass index (BMI) 33.0-33.9, adult: Secondary | ICD-10-CM | POA: Diagnosis not present

## 2015-09-21 DIAGNOSIS — Z01419 Encounter for gynecological examination (general) (routine) without abnormal findings: Secondary | ICD-10-CM | POA: Diagnosis not present

## 2015-09-21 MED FILL — valACYclovir HCL 1 GM TABS: 1 | 60 days supply | Qty: 30 | Fill #0

## 2015-09-28 ENCOUNTER — Other Ambulatory Visit: Payer: Self-pay | Admitting: Obstetrics and Gynecology

## 2015-09-28 DIAGNOSIS — R928 Other abnormal and inconclusive findings on diagnostic imaging of breast: Secondary | ICD-10-CM

## 2015-10-04 ENCOUNTER — Ambulatory Visit
Admission: RE | Admit: 2015-10-04 | Discharge: 2015-10-04 | Disposition: A | Discharge status: Home / Self Care | Payer: BLUE CROSS/BLUE SHIELD | Source: Ambulatory Visit | Attending: Obstetrics and Gynecology | Admitting: Obstetrics and Gynecology

## 2015-10-04 DIAGNOSIS — N63 Unspecified lump in breast: Secondary | ICD-10-CM | POA: Diagnosis not present

## 2015-10-04 DIAGNOSIS — R928 Other abnormal and inconclusive findings on diagnostic imaging of breast: Secondary | ICD-10-CM

## 2015-10-04 DIAGNOSIS — N6012 Diffuse cystic mastopathy of left breast: Secondary | ICD-10-CM | POA: Diagnosis not present

## 2015-10-06 ENCOUNTER — Other Ambulatory Visit: Payer: BLUE CROSS/BLUE SHIELD

## 2015-10-06 NOTE — Telephone Encounter (Signed)
Eft message on patients voicemail @ [phone] asking patient to call the office to reschedule appointment originally scheduled for 10/10 with Dr. Zola ButtonLowne-Chase

## 2015-10-12 MED FILL — FETZIMA ER 80 MG CAPSULE: 80 | 30 days supply | Qty: 30 | Fill #1

## 2015-10-18 ENCOUNTER — Ambulatory Visit: Payer: BLUE CROSS/BLUE SHIELD | Admitting: Family Medicine

## 2015-11-25 MED FILL — FETZIMA ER 80 MG CAPSULE: 80 | 30 days supply | Qty: 30 | Fill #2

## 2016-01-10 MED FILL — FETZIMA ER 80 MG CAPSULE: 80 | 30 days supply | Qty: 30 | Fill #3

## 2016-01-31 ENCOUNTER — Other Ambulatory Visit: Payer: Self-pay | Admitting: Family Medicine

## 2016-01-31 MED ORDER — LORCASERIN HCL ER 20 MG PO TB24: 1.0000 | ORAL_TABLET | Freq: Every day | ORAL | 2 refills | Status: DC

## 2016-01-31 NOTE — Telephone Encounter (Signed)
Faxed hardcopy for Belviq to [facility]

## 2016-02-24 MED FILL — FETZIMA ER 80 MG CAPSULE: 80 | 30 days supply | Qty: 30 | Fill #4

## 2016-02-28 MED FILL — valACYclovir HCL 1 GM TABS: 1 | 60 days supply | Qty: 30 | Fill #1

## 2016-04-06 ENCOUNTER — Other Ambulatory Visit: Payer: Self-pay | Admitting: Family Medicine

## 2016-04-06 DIAGNOSIS — F329 Major depressive disorder, single episode, unspecified: Secondary | ICD-10-CM

## 2016-04-06 DIAGNOSIS — F32A Depression, unspecified: Secondary | ICD-10-CM

## 2016-04-09 ENCOUNTER — Other Ambulatory Visit: Payer: Self-pay | Admitting: Family Medicine

## 2016-04-09 MED FILL — FETZIMA ER 80 MG CAPSULE: 80 | 30 days supply | Qty: 30 | Fill #0

## 2016-04-09 NOTE — Telephone Encounter (Signed)
Medication refilled

## 2016-04-09 NOTE — Telephone Encounter (Addendum)
Relation to ZO:XWRU Call back number:[phone] Pharmacy: [facility] - [place], [place] - [address] [phone] (Phone) [phone] (Fax)     Reason for call:  Patient requesting a refill FETZIMA 80 MG CP24

## 2016-04-17 ENCOUNTER — Ambulatory Visit: Payer: BLUE CROSS/BLUE SHIELD | Admitting: Family Medicine

## 2016-05-01 ENCOUNTER — Ambulatory Visit (INDEPENDENT_AMBULATORY_CARE_PROVIDER_SITE_OTHER): Payer: BLUE CROSS/BLUE SHIELD | Admitting: Family Medicine

## 2016-05-01 ENCOUNTER — Encounter: Payer: Self-pay | Admitting: Family Medicine

## 2016-05-01 VITALS — BP 116/86 | HR 63 | Temp 97.7°F | Resp 16 | Ht 63.0 in | Wt 183.4 lb

## 2016-05-01 DIAGNOSIS — F32A Depression, unspecified: Secondary | ICD-10-CM

## 2016-05-01 DIAGNOSIS — F329 Major depressive disorder, single episode, unspecified: Secondary | ICD-10-CM

## 2016-05-01 DIAGNOSIS — E782 Mixed hyperlipidemia: Secondary | ICD-10-CM

## 2016-05-01 MED ORDER — FETZIMA 80 MG PO CP24
1.0000 | ORAL_CAPSULE | Freq: Every day | ORAL | 3 refills | Status: DC
Start: 2016-05-01 — End: 2017-03-05

## 2016-05-01 NOTE — Progress Notes (Signed)
Pre visit review using our clinic review tool, if applicable. No additional management support is needed unless otherwise documented below in the visit note. 

## 2016-05-01 NOTE — Progress Notes (Signed)
Patient ID: Crystal Sandoval, female   DOB: May 25, 1975, 41 y.o.   MRN: [id number]     Subjective:  I acted as a Neurosurgeon for Dr. Zola Button.  Apolonio Schneiders, CMA   Patient ID: Crystal Sandoval, female    DOB: 08-Jan-1976, 41 y.o.   MRN: [id number]  Chief Complaint  Patient presents with  . Depression    HPI  Patient is in today for follow up depression.  She has been doing well on medication.   Pt stopped belviq because she felt it was not working for her.    Patient Care Team: Donato Schultz, DO as PCP - General (Family Medicine) Marcelle Overlie, MD as Consulting Physician (Obstetrics and Gynecology)   Past Medical History:  Diagnosis Date  . Allergy   . Asthma   . Depression   . Frequent headaches   . Migraine     Past Surgical History:  Procedure Laterality Date  . WISDOM TOOTH EXTRACTION      Family History  Problem Relation Age of Onset  . Alcohol abuse Mother     Social History   Social History  . Marital status: Married    Spouse name: N/A  . Number of children: N/A  . 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 Smoking status: Never Smoker  . Smokeless tobacco: Never Used  . Alcohol use 0.0 oz/week     Comment: Occ  . Drug use: Unknown  . Sexual activity: Not on file   Other Topics Concern  . Not on file   Social History Narrative  . No narrative on file    Outpatient Medications Prior to Visit  Medication Sig Dispense Refill  . cholecalciferol (VITAMIN D) 1000 UNITS tablet Take 1,000 Units by mouth daily.      Marland Kitchen FETZIMA 80 MG CP24 TAKE 1 CAPSULE BY MOUTH DAILY. 90 capsule 0  . Ascorbic Acid (VITAMIN C PO) Take 1 tablet by mouth daily.      . Lorcaserin HCl ER (BELVIQ XR) 20 MG TB24 Take 1 tablet by mouth daily. 30 tablet 2   No facility-administered medications prior to visit.     Allergies  Allergen Reactions  . Ibuprofen     Elevated kidney function    Review of  Systems  Constitutional: Negative for fever and malaise/fatigue.  HENT: Negative for congestion.   Eyes: Negative for blurred vision.  Respiratory: Negative for cough and shortness of breath.   Cardiovascular: Negative for chest pain, palpitations and leg swelling.  Gastrointestinal: Negative for vomiting.  Musculoskeletal: Negative for back pain.  Skin: Negative for rash.  Neurological: Negative for loss of consciousness and headaches.       Objective:    Physical Exam  Constitutional: She is oriented to person, place, and time. She appears well-developed and well-nourished. No distress.  HENT:  Head: Normocephalic and atraumatic.  Eyes: Conjunctivae are normal.  Neck: Normal range of motion. No thyromegaly present.  Cardiovascular: Normal rate and regular rhythm.   Pulmonary/Chest: Effort normal and breath sounds normal. She has no wheezes.  Abdominal: Soft. Bowel sounds are normal. There is no tenderness.  Musculoskeletal: Normal range of motion. She exhibits no edema or deformity.  Neurological: She is alert and oriented to person, place, and time.  Skin: Skin is warm and dry. She is not diaphoretic.  Psychiatric: She has a normal mood and affect. Her behavior is  normal. Judgment and thought content normal.  Nursing note and vitals reviewed.   BP 116/86 (BP Location: Left Arm, Cuff Size: Normal)   Pulse 63   Temp 97.7 F (36.5 C) (Oral)   Resp 16   Ht  (1.6 m)   Wt 183 lb 6.4 oz (83.2 kg)   LMP 04/10/2016   SpO2 97%   BMI 32.49 kg/m  Wt Readings from Last 3 Encounters:  05/01/16 183 lb 6.4 oz (83.2 kg)  07/18/15 178 lb 6.4 oz (80.9 kg)  03/25/15 179 lb 9.6 oz (81.5 kg)   BP Readings from Last 3 Encounters:  05/01/16 116/86  07/18/15 118/70  03/25/15 110/68     Immunization History  Administered Date(s) Administered  . Influenza-Unspecified 11/09/2014    Health Maintenance  Topic Date Due  . TETANUS/TDAP  06/01/1994  . INFLUENZA VACCINE  08/08/2016   . PAP SMEAR  12/08/2017  . HIV Screening  Completed    Lab Results  Component Value Date   WBC 4.9 03/25/2015   HGB 13.3 03/25/2015   HCT 37.9 03/25/2015   PLT 268 03/25/2015   GLUCOSE 96 03/25/2015   CHOL 210 (H) 03/25/2015   TRIG 82 03/25/2015   HDL 65 03/25/2015   LDLCALC 129 03/25/2015   ALT 12 03/25/2015   AST 15 03/25/2015   NA 135 03/25/2015   K 3.7 03/25/2015   CL 100 03/25/2015   CREATININE 0.88 03/25/2015   BUN 12 03/25/2015   CO2 23 03/25/2015   TSH 1.19 03/25/2015    Lab Results  Component Value Date   TSH 1.19 03/25/2015   Lab Results  Component Value Date   WBC 4.9 03/25/2015   HGB 13.3 03/25/2015   HCT 37.9 03/25/2015   MCV 88.1 03/25/2015   PLT 268 03/25/2015   Lab Results  Component Value Date   NA 135 03/25/2015   K 3.7 03/25/2015   CO2 23 03/25/2015   GLUCOSE 96 03/25/2015   BUN 12 03/25/2015   CREATININE 0.88 03/25/2015   BILITOT 0.6 03/25/2015   ALKPHOS 68 03/25/2015   AST 15 03/25/2015   ALT 12 03/25/2015   PROT 7.3 03/25/2015   ALBUMIN 4.0 03/25/2015   CALCIUM 9.0 03/25/2015   Lab Results  Component Value Date   CHOL 210 (H) 03/25/2015   Lab Results  Component Value Date   HDL 65 03/25/2015   Lab Results  Component Value Date   LDLCALC 129 03/25/2015   Lab Results  Component Value Date   TRIG 82 03/25/2015   Lab Results  Component Value Date   CHOLHDL 3.2 03/25/2015   No results found for: HGBA1C       Assessment & Plan:   Problem List Items Addressed This Visit      Unprioritized   HLD (hyperlipidemia)    Not fasting today rto for cpe      Major depressive disorder    con't fetzima rto 6 months Pt doing well       Relevant Medications   FETZIMA 80 MG CP24    Other Visit Diagnoses    Depression, unspecified depression type    -  Primary   Relevant Medications   FETZIMA 80 MG CP24   Morbid obesity (HCC)            Discussed diet and exercise.pt will look into saxenda.  I have discontinued  Ms. Kinley's Ascorbic Acid (VITAMIN C PO) and Lorcaserin HCl ER. I am also having her maintain  her cholecalciferol, valACYclovir, and FETZIMA.  Meds ordered this encounter  Medications  . valACYclovir (VALTREX) 1000 MG tablet    Refill:  11  . FETZIMA 80 MG CP24    Sig: Take 1 capsule by mouth daily.    Dispense:  90 capsule    Refill:  3    CMA served as scribe during this visit. History, Physical and Plan performed by medical provider. Documentation and orders reviewed and attested to.  Donato Schultz, DO

## 2016-05-01 NOTE — Assessment & Plan Note (Addendum)
con't fetzima rto 6 months Pt doing well

## 2016-05-01 NOTE — Patient Instructions (Signed)

## 2016-05-01 NOTE — Assessment & Plan Note (Signed)
Not fasting today rto for cpe

## 2016-05-22 ENCOUNTER — Other Ambulatory Visit: Payer: Self-pay | Admitting: Obstetrics and Gynecology

## 2016-05-22 DIAGNOSIS — N63 Unspecified lump in unspecified breast: Secondary | ICD-10-CM

## 2016-05-22 MED FILL — FETZIMA ER 80 MG CAPSULE: 80 | 30 days supply | Qty: 30 | Fill #1

## 2016-05-28 ENCOUNTER — Ambulatory Visit
Admission: RE | Admit: 2016-05-28 | Discharge: 2016-05-28 | Disposition: A | Discharge status: Home / Self Care | Payer: BLUE CROSS/BLUE SHIELD | Source: Ambulatory Visit | Attending: Obstetrics and Gynecology | Admitting: Obstetrics and Gynecology

## 2016-05-28 ENCOUNTER — Other Ambulatory Visit: Payer: Self-pay | Admitting: Obstetrics and Gynecology

## 2016-05-28 DIAGNOSIS — N63 Unspecified lump in unspecified breast: Secondary | ICD-10-CM

## 2016-05-28 DIAGNOSIS — R928 Other abnormal and inconclusive findings on diagnostic imaging of breast: Secondary | ICD-10-CM | POA: Diagnosis not present

## 2016-05-28 DIAGNOSIS — N6320 Unspecified lump in the left breast, unspecified quadrant: Secondary | ICD-10-CM | POA: Diagnosis not present

## 2016-07-09 MED FILL — FETZIMA ER 80 MG CAPSULE: 80 | 30 days supply | Qty: 30 | Fill #2

## 2016-08-22 MED FILL — FETZIMA ER 80 MG CAPSULE: 80 | 30 days supply | Qty: 30 | Fill #0

## 2016-09-17 ENCOUNTER — Telehealth: Payer: BLUE CROSS/BLUE SHIELD | Admitting: Family

## 2016-09-17 ENCOUNTER — Ambulatory Visit: Payer: BLUE CROSS/BLUE SHIELD | Admitting: Medical

## 2016-09-17 DIAGNOSIS — N390 Urinary tract infection, site not specified: Secondary | ICD-10-CM

## 2016-09-17 DIAGNOSIS — Z719 Counseling, unspecified: Secondary | ICD-10-CM

## 2016-09-17 MED ORDER — CEPHALEXIN 500 MG PO CAPS: 500.0000 mg | ORAL_CAPSULE | Freq: Two times a day (BID) | ORAL | 0 refills | Status: DC

## 2016-09-17 MED FILL — CEPHALEXIN 500 MG CAPSULE: 500 | 7 days supply | Qty: 14 | Fill #0

## 2016-09-17 NOTE — Progress Notes (Signed)
Thank you for the details you put in the comment boxes. Those details really help us take better care of you. It shows you submitted visits at 230pm, 240pm, and 248pm. Please submit only 1 visit per problem as this is confusing for our treatment team and may result in improper treatment for you including duplicate prescriptions which may be harmful to you. Our promise is to reply within 1 hour to all e-visits and we will do your e-visit as soon as possible when received. See treatment plan below.   We are sorry that you are not feeling well.  Here is how we plan to help!  Based on what you shared with me it looks like you most likely have a simple urinary tract infection.  A UTI (Urinary Tract Infection) is a bacterial infection of the bladder.  Most cases of urinary tract infections are simple to treat but a key part of your care is to encourage you to drink plenty of fluids and watch your symptoms carefully.  I have prescribed Keflex 500 mg twice a day for 7 days.  Your symptoms should gradually improve. Call us if the burning in your urine worsens, you develop worsening fever, back pain or pelvic pain or if your symptoms do not resolve after completing the antibiotic.  Urinary tract infections can be prevented by drinking plenty of water to keep your body hydrated.  Also be sure when you wipe, wipe from front to back and don't hold it in!  If possible, empty your bladder every 4 hours.  Your e-visit answers were reviewed by a board certified advanced clinical practitioner to complete your personal care plan.  Depending on the condition, your plan could have included both over the counter or prescription medications.  If there is a problem please reply  once you have received a response from your provider.  Your safety is important to us.  If you have drug allergies check your prescription carefully.    You can use MyChart to ask questions about today's visit, request a non-urgent call back, or  ask for a work or school excuse for 24 hours related to this e-Visit. If it has been greater than 24 hours you will need to follow up with your provider, or enter a new e-Visit to address those concerns.   You will get an e-mail in the next two days asking about your experience.  I hope that your e-visit has been valuable and will speed your recovery. Thank you for using e-visits.

## 2016-09-17 NOTE — Progress Notes (Signed)
Duplicate, delete, no charge.

## 2016-09-17 NOTE — Progress Notes (Signed)
Duplicate, no charge.  

## 2016-09-28 MED FILL — FETZIMA ER 80 MG CAPSULE: 80 | 30 days supply | Qty: 30 | Fill #1

## 2016-11-05 MED FILL — FETZIMA ER 80 MG CAPSULE: 80 | 30 days supply | Qty: 30 | Fill #2

## 2016-12-05 ENCOUNTER — Ambulatory Visit
Admission: RE | Admit: 2016-12-05 | Discharge: 2016-12-05 | Disposition: A | Discharge status: Home / Self Care | Payer: BLUE CROSS/BLUE SHIELD | Source: Ambulatory Visit | Attending: Obstetrics and Gynecology | Admitting: Obstetrics and Gynecology

## 2016-12-05 DIAGNOSIS — N63 Unspecified lump in unspecified breast: Secondary | ICD-10-CM

## 2016-12-05 DIAGNOSIS — R922 Inconclusive mammogram: Secondary | ICD-10-CM | POA: Diagnosis not present

## 2016-12-05 DIAGNOSIS — N6320 Unspecified lump in the left breast, unspecified quadrant: Secondary | ICD-10-CM | POA: Diagnosis not present

## 2016-12-10 MED FILL — VALACYCLOVIR HCL 500 MG TAB: 500 | 30 days supply | Qty: 30 | Fill #0

## 2016-12-10 MED FILL — FETZIMA ER 80 MG CAPSULE: 80 | 30 days supply | Qty: 30 | Fill #3

## 2017-01-15 DIAGNOSIS — Z01419 Encounter for gynecological examination (general) (routine) without abnormal findings: Secondary | ICD-10-CM | POA: Diagnosis not present

## 2017-01-15 DIAGNOSIS — Z6833 Body mass index (BMI) 33.0-33.9, adult: Secondary | ICD-10-CM | POA: Diagnosis not present

## 2017-01-15 DIAGNOSIS — Z1329 Encounter for screening for other suspected endocrine disorder: Secondary | ICD-10-CM | POA: Diagnosis not present

## 2017-01-21 ENCOUNTER — Ambulatory Visit: Payer: BLUE CROSS/BLUE SHIELD | Admitting: Family Medicine

## 2017-01-21 ENCOUNTER — Encounter: Payer: Self-pay | Admitting: Family Medicine

## 2017-01-21 VITALS — BP 138/86 | HR 80 | Temp 97.9°F | Resp 16 | Ht 63.0 in | Wt 186.4 lb

## 2017-01-21 DIAGNOSIS — F322 Major depressive disorder, single episode, severe without psychotic features: Secondary | ICD-10-CM | POA: Diagnosis not present

## 2017-01-21 MED ORDER — BUPROPION HCL ER (XL) 150 MG PO TB24: 150.0000 mg | ORAL_TABLET | Freq: Every day | ORAL | 0 refills | Status: DC

## 2017-01-21 MED FILL — BUPROPION HCL XL 150 MG TAB: 150 | 30 days supply | Qty: 30 | Fill #0

## 2017-01-21 NOTE — Progress Notes (Signed)
Patient ID: Crystal Sandoval, female   DOB: [birth year], 42 y.o.   MRN: [id number]    Subjective:  I acted as a Neurosurgeon for Dr. Zola Button.  Apolonio Schneiders, CMA   Patient ID: Ekaterini Capitano, female    DOB: April 10, 1975, 42 y.o.   MRN: [id number]  Chief Complaint  Patient presents with  . Depression    HPI  Patient is in today for decrease in sex drive due to the [facility]  It is becoming a problem with her marriage.  She talked with her gyn about this and they stated that she see her PCP and talk about getting on Wellbutrin.  Patient Care Team: Zola Button, Grayling Congress, DO as PCP - General (Family Medicine) Marcelle Overlie, MD as Consulting Physician (Obstetrics and Gynecology)   Past Medical History:  Diagnosis Date  . Allergy   . Asthma   . Depression   . Frequent headaches   . Migraine     Past Surgical History:  Procedure Laterality Date  . WISDOM TOOTH EXTRACTION      Family History  Problem Relation Age of Onset  . Alcohol abuse Mother   . Breast cancer Maternal Grandmother   . Breast cancer Paternal Grandmother     Social History   Socioeconomic History  . Marital status: Married    Spouse name: Not on file  . Number of children: Not on file  . Years of education: Not on file  . Highest education level: Not on file  Social Needs  . Financial resource strain: Not on file  . Food insecurity - worry: Not on file  . Food insecurity - inability: Not on file  . Transportation needs - medical: Not on file  . Transportation needs - non-medical: Not on file  Occupational History    Employer: [employer]    Comment: surgical tech  Tobacco Use  . Smoking status: Never Smoker  . Smokeless tobacco: Never Used  Substance and Sexual Activity  . Alcohol use: Yes    Alcohol/week: 0.0 oz    Comment: Occ  . Drug use: Not on file  . Sexual activity: Not on file  Other Topics Concern  . Not on file  Social History Narrative  . Not on file    Outpatient Medications Prior to  Visit  Medication Sig Dispense Refill  . cholecalciferol (VITAMIN D) 1000 UNITS tablet Take 1,000 Units by mouth daily.      Marland Kitchen FETZIMA 80 MG CP24 Take 1 capsule by mouth daily. 90 capsule 3  . valACYclovir (VALTREX) 1000 MG tablet   11  . cephALEXin (KEFLEX) 500 MG capsule Take 1 capsule (500 mg total) by mouth 2 (two) times daily. 14 capsule 0   No facility-administered medications prior to visit.     Allergies  Allergen Reactions  . Ibuprofen     Elevated kidney function    Review of Systems  Constitutional: Negative for fever and malaise/fatigue.  HENT: Negative for congestion.   Eyes: Negative for blurred vision.  Respiratory: Negative for cough and shortness of breath.   Cardiovascular: Negative for chest pain, palpitations and leg swelling.  Gastrointestinal: Negative for vomiting.  Musculoskeletal: Negative for back pain.  Skin: Negative for rash.  Neurological: Negative for loss of consciousness and headaches.       Objective:    Physical Exam  Constitutional: She is oriented to person, place, and time. She appears well-developed and well-nourished.  HENT:  Head: Normocephalic and atraumatic.  Eyes: Conjunctivae and EOM are  normal.  Neck: Normal range of motion. Neck supple. No JVD present. Carotid bruit is not present. No thyromegaly present.  Cardiovascular: Normal rate, regular rhythm and normal heart sounds.  No murmur heard. Pulmonary/Chest: Effort normal and breath sounds normal. No respiratory distress. She has no wheezes. She has no rales. She exhibits no tenderness.  Musculoskeletal: She exhibits no edema.  Neurological: She is alert and oriented to person, place, and time.  Psychiatric: Her speech is normal and behavior is normal. Judgment and thought content normal. Cognition and memory are normal. She exhibits a depressed mood.  Nursing note and vitals reviewed.   BP 138/86 (BP Location: Right Arm, Cuff Size: Normal)   Pulse 80   Temp 97.9 F (36.6  C) (Oral)   Resp 16   Ht 5\' 3"  (1.6 m)   Wt 186 lb 6.4 oz (84.6 kg)   LMP 01/08/2017   SpO2 97%   BMI 33.02 kg/m  Wt Readings from Last 3 Encounters:  01/21/17 186 lb 6.4 oz (84.6 kg)  05/01/16 183 lb 6.4 oz (83.2 kg)  07/18/15 178 lb 6.4 oz (80.9 kg)   BP Readings from Last 3 Encounters:  01/21/17 138/86  05/01/16 116/86  07/18/15 118/70     Immunization History  Administered Date(s) Administered  . Influenza-Unspecified 11/09/2014    Health Maintenance  Topic Date Due  . TETANUS/TDAP  06/01/1994  . PAP SMEAR  12/08/2017  . INFLUENZA VACCINE  Completed  . HIV Screening  Completed    Lab Results  Component Value Date   WBC 4.9 03/25/2015   HGB 13.3 03/25/2015   HCT 37.9 03/25/2015   PLT 268 03/25/2015   GLUCOSE 96 03/25/2015   CHOL 210 (H) 03/25/2015   TRIG 82 03/25/2015   HDL 65 03/25/2015   LDLCALC 129 03/25/2015   ALT 12 03/25/2015   AST 15 03/25/2015   NA 135 03/25/2015   K 3.7 03/25/2015   CL 100 03/25/2015   CREATININE 0.88 03/25/2015   BUN 12 03/25/2015   CO2 23 03/25/2015   TSH 1.19 03/25/2015    Lab Results  Component Value Date   TSH 1.19 03/25/2015   Lab Results  Component Value Date   WBC 4.9 03/25/2015   HGB 13.3 03/25/2015   HCT 37.9 03/25/2015   MCV 88.1 03/25/2015   PLT 268 03/25/2015   Lab Results  Component Value Date   NA 135 03/25/2015   K 3.7 03/25/2015   CO2 23 03/25/2015   GLUCOSE 96 03/25/2015   BUN 12 03/25/2015   CREATININE 0.88 03/25/2015   BILITOT 0.6 03/25/2015   ALKPHOS 68 03/25/2015   AST 15 03/25/2015   ALT 12 03/25/2015   PROT 7.3 03/25/2015   ALBUMIN 4.0 03/25/2015   CALCIUM 9.0 03/25/2015   Lab Results  Component Value Date   CHOL 210 (H) 03/25/2015   Lab Results  Component Value Date   HDL 65 03/25/2015   Lab Results  Component Value Date   LDLCALC 129 03/25/2015   Lab Results  Component Value Date   TRIG 82 03/25/2015   Lab Results  Component Value Date   CHOLHDL 3.2 03/25/2015    No results found for: HGBA1C       Assessment & Plan:   Problem List Items Addressed This Visit    None    Visit Diagnoses    Depression, major, single episode, severe (HCC)    -  Primary   Relevant Medications   buPROPion (WELLBUTRIN XL)  150 MG 24 hr tablet      con't fetzima rto 3 months   I have discontinued Lexany Folta's cephALEXin. I am also having her start on buPROPion. Additionally, I am having her maintain her cholecalciferol, valACYclovir, and FETZIMA.  Meds ordered this encounter  Medications  . buPROPion (WELLBUTRIN XL) 150 MG 24 hr tablet    Sig: Take 1 tablet (150 mg total) by mouth daily.    Dispense:  30 tablet    Refill:  0   CMA served as scribe during this visit. History, Physical and Plan performed by medical provider. Documentation and orders reviewed and attested to.  Donato Schultz, DO

## 2017-01-21 NOTE — Patient Instructions (Addendum)

## 2017-01-23 MED FILL — FETZIMA ER 80 MG CAPSULE: 80 | 30 days supply | Qty: 30 | Fill #4

## 2017-02-04 ENCOUNTER — Telehealth: Payer: Self-pay

## 2017-02-04 ENCOUNTER — Other Ambulatory Visit: Payer: Self-pay | Admitting: Family Medicine

## 2017-02-04 DIAGNOSIS — F320 Major depressive disorder, single episode, mild: Secondary | ICD-10-CM

## 2017-02-04 MED ORDER — BUPROPION HCL ER (XL) 300 MG PO TB24: 300.0000 mg | ORAL_TABLET | Freq: Every day | ORAL | 1 refills | Status: DC

## 2017-02-04 NOTE — Telephone Encounter (Signed)
Copied from CRM [phone]#[id number]. Topic: Inquiry >> Feb 04, 2017 10:48 AM [name], Andra M wrote: Reason for CRM: Patient called requesting to change her prescription of buPROPion (WELLBUTRIN XL) 150 MG 24 hr tablet to the 300 MG tablets. Patient stated that Dr. Zola ButtonLowne-Chase wanted her to call in and request the 300 MG tablets if she felt she needed them.        Thank You!!!

## 2017-02-04 NOTE — Telephone Encounter (Signed)
Sent to pharmacy 

## 2017-02-05 MED FILL — BUPROPION HCL XL 300 MG TAB: 300 | 30 days supply | Qty: 30 | Fill #0

## 2017-03-04 ENCOUNTER — Telehealth: Payer: Self-pay | Admitting: *Deleted

## 2017-03-04 DIAGNOSIS — F32A Depression, unspecified: Secondary | ICD-10-CM

## 2017-03-04 DIAGNOSIS — F329 Major depressive disorder, single episode, unspecified: Secondary | ICD-10-CM

## 2017-03-04 MED FILL — BUPROPION HCL XL 300 MG TAB: 300 | 30 days supply | Qty: 30 | Fill #1

## 2017-03-04 MED FILL — FETZIMA ER 80 MG CAPSULE: 80 | 30 days supply | Qty: 30 | Fill #5

## 2017-03-04 NOTE — Telephone Encounter (Signed)
Copied from CRM [phone]#[id number]. Topic: Appointment Scheduling - Scheduling Inquiry for Clinic >> Mar 04, 2017  9:16 AM Arlyss Gandyichardson, Taren N, NT wrote: Reason for CRM: PT would like to know if after this refill of her  FETZIMA 80 MG if she will need an appt with Dr. Zola ButtonLowne-Chase for future refills? Please advise

## 2017-03-05 MED ORDER — FETZIMA 80 MG PO CP24: 1.0000 | ORAL_CAPSULE | Freq: Every day | ORAL | 0 refills | Status: DC

## 2017-03-05 NOTE — Telephone Encounter (Signed)
Patient notified that rx is refilled and to follow up in April.  She will call back and make appt.

## 2017-04-02 MED FILL — BUPROPION HCL XL 300 MG TAB: 300 | 30 days supply | Qty: 30 | Fill #2

## 2017-04-02 MED FILL — FETZIMA ER 80 MG CAPSULE: 80 | 30 days supply | Qty: 30 | Fill #6

## 2017-04-08 MED FILL — VALACYCLOVIR HCL 500 MG TAB: 500 | 30 days supply | Qty: 30 | Fill #1

## 2017-04-30 MED FILL — FETZIMA ER 80 MG CAPSULE: 80 | 30 days supply | Qty: 30 | Fill #7

## 2017-04-30 MED FILL — BUPROPION HCL XL 300 MG TAB: 300 | 30 days supply | Qty: 30 | Fill #3

## 2017-06-10 ENCOUNTER — Other Ambulatory Visit: Payer: Self-pay | Admitting: Family Medicine

## 2017-06-10 DIAGNOSIS — F32A Depression, unspecified: Secondary | ICD-10-CM

## 2017-06-10 DIAGNOSIS — F329 Major depressive disorder, single episode, unspecified: Secondary | ICD-10-CM

## 2017-06-11 MED ORDER — FETZIMA 80 MG PO CP24: 1.0000 | ORAL_CAPSULE | Freq: Every day | ORAL | 0 refills | Status: DC

## 2017-06-11 MED FILL — BUPROPION HCL XL 300 MG TAB: 300 | 30 days supply | Qty: 30 | Fill #4

## 2017-06-11 MED FILL — FETZIMA ER 80 MG CAPSULE: 80 | 30 days supply | Qty: 30 | Fill #0 | Status: TO

## 2017-06-11 MED FILL — VALACYCLOVIR HCL 500 MG TAB: 500 | 30 days supply | Qty: 30 | Fill #0

## 2017-07-01 ENCOUNTER — Encounter: Payer: Self-pay | Admitting: Family Medicine

## 2017-07-01 ENCOUNTER — Ambulatory Visit: Payer: BLUE CROSS/BLUE SHIELD | Admitting: Family Medicine

## 2017-07-01 VITALS — BP 144/77 | HR 85 | Temp 98.5°F | Resp 18 | Wt 173.2 lb

## 2017-07-01 DIAGNOSIS — R591 Generalized enlarged lymph nodes: Secondary | ICD-10-CM

## 2017-07-01 DIAGNOSIS — F329 Major depressive disorder, single episode, unspecified: Secondary | ICD-10-CM | POA: Diagnosis not present

## 2017-07-01 DIAGNOSIS — F32A Depression, unspecified: Secondary | ICD-10-CM

## 2017-07-01 DIAGNOSIS — Z23 Encounter for immunization: Secondary | ICD-10-CM | POA: Diagnosis not present

## 2017-07-01 MED ORDER — FETZIMA 80 MG PO CP24: 1.0000 | ORAL_CAPSULE | Freq: Every day | ORAL | 3 refills | Status: DC

## 2017-07-01 MED ORDER — CEPHALEXIN 500 MG PO CAPS: 500.0000 mg | ORAL_CAPSULE | Freq: Two times a day (BID) | ORAL | 0 refills | Status: DC

## 2017-07-01 MED FILL — CEPHALEXIN 500 MG CAPSULE: 500 | 10 days supply | Qty: 20 | Fill #0

## 2017-07-01 NOTE — Assessment & Plan Note (Signed)
abx and labs per orders If not improvement --- consider us/ ct

## 2017-07-01 NOTE — Progress Notes (Signed)
Subjective:  I acted as a Neurosurgeon for [employer]. Fuller Song, RMA   Patient ID: Crystal Sandoval, female    DOB: May 24, 1975, 42 y.o.   MRN: [id number]  Chief Complaint  Patient presents with  . medication follow up  . Cyst    both sides of neck    HPI  Patient is in today for knots on both side of neck, and medication follow up.  Patient Care Team: Zola Button, Grayling Congress, DO as PCP - General (Family Medicine) Marcelle Overlie, MD as Consulting Physician (Obstetrics and Gynecology)   Past Medical History:  Diagnosis Date  . Allergy   . Asthma   . Depression   . Frequent headaches   . Migraine     Past Surgical History:  Procedure Laterality Date  . WISDOM TOOTH EXTRACTION      Family History  Problem Relation Age of Onset  . Alcohol abuse Mother   . Breast cancer Maternal Grandmother   . Breast cancer Paternal Grandmother     Social History   Socioeconomic History  . Marital status: Married    Spouse name: Not on file  . Number of children: Not on file  . Years of education: Not on file  . Highest education level: Not on file  Occupational History    Employer: [employer]    Comment: surgical tech  Social Needs  . Financial resource strain: Not on file  . Food insecurity:    Worry: Not on file    Inability: Not on file  . Transportation needs:    Medical: Not on file    Non-medical: Not on file  Tobacco Use  . Smoking status: Never Smoker  . Smokeless tobacco: Never Used  Substance and Sexual Activity  . Alcohol use: Yes    Alcohol/week: 0.0 oz    Comment: Occ  . Drug use: Not on file  . Sexual activity: Not on file  Lifestyle  . Physical activity:    Days per week: Not on file    Minutes per session: Not on file  . Stress: Not on file  Relationships  . Social connections:    Talks on phone: Not on file    Gets together: Not on file    Attends religious service: Not on file    Active member of club or organization: Not on file    Attends  meetings of clubs or organizations: Not on file    Relationship status: Not on file  . Intimate partner violence:    Fear of current or ex partner: Not on file    Emotionally abused: Not on file    Physically abused: Not on file    Forced sexual activity: Not on file  Other Topics Concern  . Not on file  Social History Narrative  . Not on file    Outpatient Medications Prior to Visit  Medication Sig Dispense Refill  . buPROPion (WELLBUTRIN XL) 300 MG 24 hr tablet Take 1 tablet (300 mg total) by mouth daily. 90 tablet 1  . cholecalciferol (VITAMIN D) 1000 UNITS tablet Take 1,000 Units by mouth daily.      . valACYclovir (VALTREX) 1000 MG tablet   11  . FETZIMA 80 MG CP24 Take 1 capsule by mouth daily. 90 capsule 0   No facility-administered medications prior to visit.     Allergies  Allergen Reactions  . Ibuprofen     Elevated kidney function    Review of Systems  Constitutional: Negative for  chills, fever and malaise/fatigue.  HENT: Negative for congestion and hearing loss.   Eyes: Negative for discharge.  Respiratory: Negative for cough, sputum production and shortness of breath.   Cardiovascular: Negative for chest pain, palpitations and leg swelling.  Gastrointestinal: Negative for abdominal pain, blood in stool, constipation, diarrhea, heartburn, nausea and vomiting.  Genitourinary: Negative for dysuria, frequency, hematuria and urgency.  Musculoskeletal: Negative for back pain, falls and myalgias.  Skin: Negative for rash.  Neurological: Negative for dizziness, sensory change, loss of consciousness, weakness and headaches.  Endo/Heme/Allergies: Negative for environmental allergies. Does not bruise/bleed easily.  Psychiatric/Behavioral: Negative for depression and suicidal ideas. The patient is not nervous/anxious and does not have insomnia.        Objective:    Physical Exam  Constitutional: She is oriented to person, place, and time. She appears well-developed  and well-nourished.  HENT:  Head: Normocephalic and atraumatic.  Eyes: Conjunctivae and EOM are normal.  Neck: Normal range of motion. Neck supple. No JVD present. Carotid bruit is not present. No thyromegaly present.    Cardiovascular: Normal rate, regular rhythm and normal heart sounds.  No murmur heard. Pulmonary/Chest: Effort normal and breath sounds normal. No respiratory distress. She has no wheezes. She has no rales. She exhibits no tenderness.  Musculoskeletal: She exhibits no edema.  Lymphadenopathy:    She has cervical adenopathy.  Neurological: She is alert and oriented to person, place, and time.  Psychiatric: She has a normal mood and affect. Her behavior is normal. Judgment and thought content normal.  Nursing note and vitals reviewed.   BP (!) 144/77 (BP Location: Left Arm, Patient Position: Sitting, Cuff Size: Normal)   Pulse 85   Temp 98.5 F (36.9 C) (Oral)   Resp 18   Wt 173 lb 3.2 oz (78.6 kg)   SpO2 100%   BMI 30.68 kg/m  Wt Readings from Last 3 Encounters:  07/01/17 173 lb 3.2 oz (78.6 kg)  01/21/17 186 lb 6.4 oz (84.6 kg)  05/01/16 183 lb 6.4 oz (83.2 kg)   BP Readings from Last 3 Encounters:  07/01/17 (!) 144/77  01/21/17 138/86  05/01/16 116/86     Immunization History  Administered Date(s) Administered  . Influenza-Unspecified 11/09/2014  . Tdap 07/01/2017    Health Maintenance  Topic Date Due  . TETANUS/TDAP  07/02/2018 (Originally 06/01/1994)  . INFLUENZA VACCINE  08/08/2017  . PAP SMEAR  12/08/2017  . HIV Screening  Completed    Lab Results  Component Value Date   WBC 4.9 03/25/2015   HGB 13.3 03/25/2015   HCT 37.9 03/25/2015   PLT 268 03/25/2015   GLUCOSE 96 03/25/2015   CHOL 210 (H) 03/25/2015   TRIG 82 03/25/2015   HDL 65 03/25/2015   LDLCALC 129 03/25/2015   ALT 12 03/25/2015   AST 15 03/25/2015   NA 135 03/25/2015   K 3.7 03/25/2015   CL 100 03/25/2015   CREATININE 0.88 03/25/2015   BUN 12 03/25/2015   CO2 23  03/25/2015   TSH 1.19 03/25/2015    Lab Results  Component Value Date   TSH 1.19 03/25/2015   Lab Results  Component Value Date   WBC 4.9 03/25/2015   HGB 13.3 03/25/2015   HCT 37.9 03/25/2015   MCV 88.1 03/25/2015   PLT 268 03/25/2015   Lab Results  Component Value Date   NA 135 03/25/2015   K 3.7 03/25/2015   CO2 23 03/25/2015   GLUCOSE 96 03/25/2015   BUN 12  03/25/2015   CREATININE 0.88 03/25/2015   BILITOT 0.6 03/25/2015   ALKPHOS 68 03/25/2015   AST 15 03/25/2015   ALT 12 03/25/2015   PROT 7.3 03/25/2015   ALBUMIN 4.0 03/25/2015   CALCIUM 9.0 03/25/2015   Lab Results  Component Value Date   CHOL 210 (H) 03/25/2015   Lab Results  Component Value Date   HDL 65 03/25/2015   Lab Results  Component Value Date   LDLCALC 129 03/25/2015   Lab Results  Component Value Date   TRIG 82 03/25/2015   Lab Results  Component Value Date   CHOLHDL 3.2 03/25/2015   No results found for: HGBA1C       Assessment & Plan:   Problem List Items Addressed This Visit    None    Visit Diagnoses    Lymphadenopathy    -  Primary   Relevant Medications   cephALEXin (KEFLEX) 500 MG capsule   Other Relevant Orders   CBC with Differential/Platelet   Depression, unspecified depression type       Relevant Medications   FETZIMA 80 MG CP24      I am having Margaux Dea start on cephALEXin. I am also having her maintain her cholecalciferol, valACYclovir, buPROPion, and FETZIMA.  Meds ordered this encounter  Medications  . FETZIMA 80 MG CP24    Sig: Take 1 capsule by mouth daily.    Dispense:  90 capsule    Refill:  3    Requested drug refills are authorized, however, the patient needs further evaluation and/or laboratory testing before further refills are given. Ask her to make an appointment for this.  . cephALEXin (KEFLEX) 500 MG capsule    Sig: Take 1 capsule (500 mg total) by mouth 2 (two) times daily.    Dispense:  20 capsule    Refill:  0    CMA served as  scribe during this visit. History, Physical and Plan performed by medical provider. Documentation and orders reviewed and attested to.  Donato SchultzYvonne R Lowne Chase, DO

## 2017-07-01 NOTE — Patient Instructions (Signed)
Lymphadenopathy Lymphadenopathy refers to swollen or enlarged lymph glands, also called lymph nodes. Lymph glands are part of your body's defense (immune) system, which protects the body from infections, germs, and diseases. Lymph glands are found in many locations in your body, including the neck, underarm, and groin. Many things can cause lymph glands to become enlarged. When your immune system responds to germs, such as viruses or bacteria, infection-fighting cells and fluid build up. This causes the glands to grow in size. Usually, this is not something to worry about. The swelling and any soreness often go away without treatment. However, swollen lymph glands can also be caused by a number of diseases. Your health care provider may do various tests to help determine the cause. If the cause of your swollen lymph glands cannot be found, it is important to monitor your condition to make sure the swelling goes away. Follow these instructions at home: Watch your condition for any changes. The following actions may help to lessen any discomfort you are feeling:  Get plenty of rest.  Take medicines only as directed by your health care provider. Your health care provider may recommend over-the-counter medicines for pain.  Apply moist heat compresses to the site of swollen lymph nodes as directed by your health care provider. This can help reduce any pain.  Check your lymph nodes daily for any changes.  Keep all follow-up visits as directed by your health care provider. This is important.  Contact a health care provider if:  Your lymph nodes are still swollen after 2 weeks.  Your swelling increases or spreads to other areas.  Your lymph nodes are hard, seem fixed to the skin, or are growing rapidly.  Your skin over the lymph nodes is red and inflamed.  You have a fever.  You have chills.  You have fatigue.  You develop a sore throat.  You have abdominal pain.  You have weight  loss.  You have night sweats. Get help right away if:  You notice fluid leaking from the area of the enlarged lymph node.  You have severe pain in any area of your body.  You have chest pain.  You have shortness of breath. This information is not intended to replace advice given to you by your health care provider. Make sure you discuss any questions you have with your health care provider. Document Released: 10/04/2007 Document Revised: 06/02/2015 Document Reviewed: 07/30/2013 Elsevier Interactive Patient Education  2018 Elsevier Inc.  

## 2017-07-02 ENCOUNTER — Ambulatory Visit: Payer: BLUE CROSS/BLUE SHIELD | Admitting: Family Medicine

## 2017-07-02 LAB — CBC WITH DIFFERENTIAL/PLATELET
BASOS ABS: 0 10*3/uL (ref 0.0–0.1)
Basophils Relative: 1 % (ref 0.0–3.0)
EOS ABS: 0 10*3/uL (ref 0.0–0.7)
Eosinophils Relative: 1 % (ref 0.0–5.0)
HCT: 41 % (ref 36.0–46.0)
Hemoglobin: 13.8 g/dL (ref 12.0–15.0)
LYMPHS ABS: 1.7 10*3/uL (ref 0.7–4.0)
Lymphocytes Relative: 45.4 % (ref 12.0–46.0)
MCHC: 33.6 g/dL (ref 30.0–36.0)
MCV: 91.7 fl (ref 78.0–100.0)
MONO ABS: 0.5 10*3/uL (ref 0.1–1.0)
Monocytes Relative: 14.1 % — ABNORMAL HIGH (ref 3.0–12.0)
NEUTROS ABS: 1.4 10*3/uL (ref 1.4–7.7)
NEUTROS PCT: 38.5 % — AB (ref 43.0–77.0)
PLATELETS: 306 10*3/uL (ref 150.0–400.0)
RBC: 4.47 Mil/uL (ref 3.87–5.11)
RDW: 13.2 % (ref 11.5–15.5)
WBC: 3.7 10*3/uL — ABNORMAL LOW (ref 4.0–10.5)

## 2017-07-08 ENCOUNTER — Telehealth: Payer: Self-pay | Admitting: Family Medicine

## 2017-07-08 NOTE — Telephone Encounter (Signed)
Left message on machine for patient to call back.

## 2017-07-08 NOTE — Telephone Encounter (Signed)
Copied from CRM [phone]#[id number]. Topic: Quick Communication - See Telephone Encounter >> Jul 08, 2017 10:28 AM Louie BunPalacios Medina, Rosey Batheresa D wrote: CRM for notification. See Telephone encounter for: 07/08/17. Patient called and said that she still feels the same and is in 7 days from her antibiotic and wants to know if she continues taking meds or what else can she do to start to feel better. Please call patient back, thanks.

## 2017-07-08 NOTE — Telephone Encounter (Signed)
I would recommend that she schedule a follow up office visit to further evaluate her symptoms please.

## 2017-07-09 NOTE — Telephone Encounter (Signed)
Patient has appointment tomorrow

## 2017-07-10 ENCOUNTER — Ambulatory Visit: Payer: BLUE CROSS/BLUE SHIELD | Admitting: Family

## 2017-07-10 ENCOUNTER — Encounter: Payer: Self-pay | Admitting: Family

## 2017-07-10 DIAGNOSIS — L049 Acute lymphadenitis, unspecified: Secondary | ICD-10-CM

## 2017-07-10 LAB — CBC WITH DIFFERENTIAL/PLATELET
BASOS PCT: 1.3 % (ref 0.0–3.0)
Basophils Absolute: 0.1 10*3/uL (ref 0.0–0.1)
EOS PCT: 0.9 % (ref 0.0–5.0)
Eosinophils Absolute: 0 10*3/uL (ref 0.0–0.7)
HCT: 37.7 % (ref 36.0–46.0)
Hemoglobin: 13.1 g/dL (ref 12.0–15.0)
LYMPHS ABS: 1.8 10*3/uL (ref 0.7–4.0)
Lymphocytes Relative: 35.9 % (ref 12.0–46.0)
MCHC: 34.6 g/dL (ref 30.0–36.0)
MCV: 91 fl (ref 78.0–100.0)
MONOS PCT: 10.9 % (ref 3.0–12.0)
Monocytes Absolute: 0.6 10*3/uL (ref 0.1–1.0)
NEUTROS ABS: 2.6 10*3/uL (ref 1.4–7.7)
Neutrophils Relative %: 51 % (ref 43.0–77.0)
PLATELETS: 273 10*3/uL (ref 150.0–400.0)
RBC: 4.14 Mil/uL (ref 3.87–5.11)
RDW: 13.1 % (ref 11.5–15.5)
WBC: 5.1 10*3/uL (ref 4.0–10.5)

## 2017-07-10 MED FILL — BUPROPION HCL XL 300 MG TAB: 300 | 30 days supply | Qty: 30 | Fill #5

## 2017-07-10 NOTE — Patient Instructions (Signed)
Please complete lab work prior to leaving. You will be contacted about scheduling CT scan of your neck.

## 2017-07-10 NOTE — Progress Notes (Signed)
Subjective:    Patient ID: Crystal Sandoval, female    DOB: 04/23/1975, 42 y.o.   MRN: [id number]  HPI  Patient is a 42 yr old female who presents today for follow up.   She was seen by PCP on 6/24 for neck lymphadenopathy and was treated with keflex.   She reports left sided lymph node is unchanged in size, right sided LN has enlarged. Denies tenderness, fever, or fatigue.  Denies sore throat, ear pain, scalp lesions/rash.  Lymphadenopathy started about 3 weeks ago.   Review of Systems    see HPI  Past Medical History:  Diagnosis Date  . Allergy   . Asthma   . Depression   . Frequent headaches   . Migraine      Social History   Socioeconomic History  . Marital status: Married    Spouse name: Not on file  . Number of children: Not on file  . Years of education: Not on file  . Highest education level: Not on file  Occupational History    Employer: [employer]    Comment: surgical tech  Social Needs  . Financial resource strain: Not on file  . Food insecurity:    Worry: Not on file    Inability: Not on file  . Transportation needs:    Medical: Not on file    Non-medical: Not on file  Tobacco Use  . Smoking status: Never Smoker  . Smokeless tobacco: Never Used  Substance and Sexual Activity  . Alcohol use: Yes    Alcohol/week: 0.0 oz    Comment: Occ  . Drug use: Not on file  . Sexual activity: Not on file  Lifestyle  . Physical activity:    Days per week: Not on file    Minutes per session: Not on file  . Stress: Not on file  Relationships  . Social connections:    Talks on phone: Not on file    Gets together: Not on file    Attends religious service: Not on file    Active member of club or organization: Not on file    Attends meetings of clubs or organizations: Not on file    Relationship status: Not on file  . Intimate partner violence:    Fear of current or ex partner: Not on file    Emotionally abused: Not on file    Physically abused: Not on  file    Forced sexual activity: Not on file  Other Topics Concern  . Not on file  Social History Narrative  . Not on file    Past Surgical History:  Procedure Laterality Date  . WISDOM TOOTH EXTRACTION      Family History  Problem Relation Age of Onset  . Alcohol abuse Mother   . Breast cancer Maternal Grandmother   . Breast cancer Paternal Grandmother     Allergies  Allergen Reactions  . Ibuprofen     Elevated kidney function    Current Outpatient Medications on File Prior to Visit  Medication Sig Dispense Refill  . buPROPion (WELLBUTRIN XL) 300 MG 24 hr tablet Take 1 tablet (300 mg total) by mouth daily. 90 tablet 1  . cephALEXin (KEFLEX) 500 MG capsule Take 1 capsule (500 mg total) by mouth 2 (two) times daily. 20 capsule 0  . cholecalciferol (VITAMIN D) 1000 UNITS tablet Take 1,000 Units by mouth daily.      Marland Kitchen. FETZIMA 80 MG CP24 Take 1 capsule by mouth daily. 90 capsule  3  . valACYclovir (VALTREX) 1000 MG tablet   11   No current facility-administered medications on file prior to visit.     BP 120/70 (BP Location: Right Arm, Patient Position: Sitting, Cuff Size: Normal)   Pulse 89   Temp 97.9 F (36.6 C) (Oral)   Resp 16   Ht 5\' 3"  (1.6 m)   Wt 177 lb (80.3 kg)   SpO2 100%   BMI 31.35 kg/m    Objective:   Physical Exam  Constitutional: She appears well-developed and well-nourished.  Cardiovascular: Normal rate, regular rhythm and normal heart sounds.  No murmur heard. Pulmonary/Chest: Effort normal and breath sounds normal. No respiratory distress. She has no wheezes.  Lymphadenopathy:       Head (right side): No submental, no submandibular, no tonsillar, no preauricular, no posterior auricular and no occipital adenopathy present.       Head (left side): No submental, no submandibular, no tonsillar, no preauricular, no posterior auricular and no occipital adenopathy present.    She has no axillary adenopathy.       Right: No inguinal and no  supraclavicular adenopathy present.       Left: No inguinal and no supraclavicular adenopathy present.  + approximately 1.5 cm left posterior cervical lymph node Similar sized lymph node noted at base of right anterior cervical chain  Psychiatric: She has a normal mood and affect. Her behavior is normal. Judgment and thought content normal.          Assessment & Plan:  Lymphadenopathy- follow up cbc unchanged. Check follow up hiv screen (denies risk factors for hiv).  Will also obtain CT neck to further assess lymph nodes/size in neck.

## 2017-07-11 LAB — HCG, SERUM, QUALITATIVE: PREG SERUM: NEGATIVE

## 2017-07-11 LAB — HIV ANTIBODY (ROUTINE TESTING W REFLEX): HIV: NONREACTIVE

## 2017-07-13 ENCOUNTER — Ambulatory Visit (HOSPITAL_BASED_OUTPATIENT_CLINIC_OR_DEPARTMENT_OTHER)
Admission: RE | Admit: 2017-07-13 | Discharge: 2017-07-13 | Disposition: A | Discharge status: Home / Self Care | Payer: BLUE CROSS/BLUE SHIELD | Source: Ambulatory Visit | Attending: Family | Admitting: Family

## 2017-07-13 DIAGNOSIS — L049 Acute lymphadenitis, unspecified: Secondary | ICD-10-CM | POA: Insufficient documentation

## 2017-07-13 DIAGNOSIS — R221 Localized swelling, mass and lump, neck: Secondary | ICD-10-CM | POA: Diagnosis not present

## 2017-07-13 MED ORDER — IOPAMIDOL (ISOVUE-300) INJECTION 61%
80.0000 mL | Freq: Once | INTRAVENOUS | Status: AC | PRN
  Administered 2017-07-13: 100 mL via INTRAVENOUS

## 2017-07-16 ENCOUNTER — Telehealth: Payer: Self-pay | Admitting: Family Medicine

## 2017-07-16 ENCOUNTER — Telehealth: Payer: Self-pay

## 2017-07-16 NOTE — Telephone Encounter (Signed)
Pt returning call to [place]. States they were speaking and lost connection.

## 2017-07-16 NOTE — Telephone Encounter (Signed)
[facility]. Author relayed message from JacksonMelissa, NP on 7/7 re: CT neck lymph node inflammation, and recommendatoin to follow up with Dr. Laury AxonLowne in one month. Pt. Stated she was unable to make an appointment at this time, but would call back to do so.

## 2017-07-16 NOTE — Telephone Encounter (Signed)
Copied from CRM [id number]. Topic: Quick Communication - See Telephone Encounter >> Jul 16, 2017  1:11 PM Kathi SimpersFergerson, Tricia A, CMA wrote: CRM for notification. See result note from 07/13/17.   Patient called and states she was returning a missed call . Please call back

## 2017-07-16 NOTE — Telephone Encounter (Signed)
Left message for pt to return my call. Please see imaging result note that I forwarded to [facility] / triage from 07/13/17.

## 2017-07-17 ENCOUNTER — Other Ambulatory Visit (HOSPITAL_BASED_OUTPATIENT_CLINIC_OR_DEPARTMENT_OTHER): Payer: BLUE CROSS/BLUE SHIELD

## 2017-07-17 NOTE — Telephone Encounter (Signed)
noted 

## 2017-08-12 ENCOUNTER — Ambulatory Visit: Payer: BLUE CROSS/BLUE SHIELD | Admitting: Family Medicine

## 2017-08-12 ENCOUNTER — Encounter: Payer: Self-pay | Admitting: Family Medicine

## 2017-08-12 ENCOUNTER — Other Ambulatory Visit: Payer: Self-pay | Admitting: Family Medicine

## 2017-08-12 VITALS — BP 133/80 | HR 77 | Temp 98.7°F | Resp 16 | Ht 63.0 in | Wt 175.0 lb

## 2017-08-12 DIAGNOSIS — L049 Acute lymphadenitis, unspecified: Secondary | ICD-10-CM

## 2017-08-12 DIAGNOSIS — F320 Major depressive disorder, single episode, mild: Secondary | ICD-10-CM

## 2017-08-12 MED FILL — FETZIMA ER 80 MG CAPSULE: 80 | 30 days supply | Qty: 30 | Fill #0

## 2017-08-12 MED FILL — buPROPion HCL ER (XL) 300 M: 300 | 30 days supply | Qty: 30 | Fill #0

## 2017-08-12 NOTE — Patient Instructions (Signed)
Lymphadenopathy Lymphadenopathy refers to swollen or enlarged lymph glands, also called lymph nodes. Lymph glands are part of your body's defense (immune) system, which protects the body from infections, germs, and diseases. Lymph glands are found in many locations in your body, including the neck, underarm, and groin. Many things can cause lymph glands to become enlarged. When your immune system responds to germs, such as viruses or bacteria, infection-fighting cells and fluid build up. This causes the glands to grow in size. Usually, this is not something to worry about. The swelling and any soreness often go away without treatment. However, swollen lymph glands can also be caused by a number of diseases. Your health care provider may do various tests to help determine the cause. If the cause of your swollen lymph glands cannot be found, it is important to monitor your condition to make sure the swelling goes away. Follow these instructions at home: Watch your condition for any changes. The following actions may help to lessen any discomfort you are feeling:  Get plenty of rest.  Take medicines only as directed by your health care provider. Your health care provider may recommend over-the-counter medicines for pain.  Apply moist heat compresses to the site of swollen lymph nodes as directed by your health care provider. This can help reduce any pain.  Check your lymph nodes daily for any changes.  Keep all follow-up visits as directed by your health care provider. This is important.  Contact a health care provider if:  Your lymph nodes are still swollen after 2 weeks.  Your swelling increases or spreads to other areas.  Your lymph nodes are hard, seem fixed to the skin, or are growing rapidly.  Your skin over the lymph nodes is red and inflamed.  You have a fever.  You have chills.  You have fatigue.  You develop a sore throat.  You have abdominal pain.  You have weight  loss.  You have night sweats. Get help right away if:  You notice fluid leaking from the area of the enlarged lymph node.  You have severe pain in any area of your body.  You have chest pain.  You have shortness of breath. This information is not intended to replace advice given to you by your health care provider. Make sure you discuss any questions you have with your health care provider. Document Released: 10/04/2007 Document Revised: 06/02/2015 Document Reviewed: 07/30/2013 Elsevier Interactive Patient Education  2018 Elsevier Inc.  

## 2017-08-12 NOTE — Progress Notes (Signed)
Patient ID: Crystal Sandoval, female   DOB: 09/13/1975, 42 y.o.   MRN: [id number]     Subjective:  I acted as a Neurosurgeon for Dr. Zola Button.  Apolonio Schneiders, CMA   Patient ID: Crystal Sandoval, female    DOB: 02/28/1975, 42 y.o.   MRN: [id number]  Chief Complaint  Patient presents with  . Lymphadenopathy    HPI  Patient is in today for follow up neck nodule.  She states it has gotten smaller, like when she first noticed the nodule.  No other complaints--  It is barely palpable now.    Patient Care Team: Zola Button, Grayling Congress, DO as PCP - General (Family Medicine) Marcelle Overlie, MD as Consulting Physician (Obstetrics and Gynecology)   Past Medical History:  Diagnosis Date  . Allergy   . Asthma   . Depression   . Frequent headaches   . Migraine     Past Surgical History:  Procedure Laterality Date  . WISDOM TOOTH EXTRACTION      Family History  Problem Relation Age of Onset  . Alcohol abuse Mother   . Breast cancer Maternal Grandmother   . Breast cancer Paternal Grandmother     Social History   Socioeconomic History  . Marital status: Married    Spouse name: Not on file  . Number of children: Not on file  . Years of education: Not on file  . Highest education level: Not on file  Occupational History    Employer: [employer]    Comment: surgical tech  Social Needs  . Financial resource strain: Not on file  . Food insecurity:    Worry: Not on file    Inability: Not on file  . Transportation needs:    Medical: Not on file    Non-medical: Not on file  Tobacco Use  . Smoking status: Never Smoker  . Smokeless tobacco: Never Used  Substance and Sexual Activity  . Alcohol use: Yes    Alcohol/week: 0.0 oz    Comment: Occ  . Drug use: Not on file  . Sexual activity: Not on file  Lifestyle  . Physical activity:    Days per week: Not on file    Minutes per session: Not on file  . Stress: Not on file  Relationships  . Social connections:    Talks on phone: Not on file     Gets together: Not on file    Attends religious service: Not on file    Active member of club or organization: Not on file    Attends meetings of clubs or organizations: Not on file    Relationship status: Not on file  . Intimate partner violence:    Fear of current or ex partner: Not on file    Emotionally abused: Not on file    Physically abused: Not on file    Forced sexual activity: Not on file  Other Topics Concern  . Not on file  Social History Narrative  . Not on file    Outpatient Medications Prior to Visit  Medication Sig Dispense Refill  . buPROPion (WELLBUTRIN XL) 300 MG 24 hr tablet Take 1 tablet (300 mg total) by mouth daily. 90 tablet 1  . cholecalciferol (VITAMIN D) 1000 UNITS tablet Take 1,000 Units by mouth daily.      Marland Kitchen FETZIMA 80 MG CP24 Take 1 capsule by mouth daily. 90 capsule 3  . valACYclovir (VALTREX) 1000 MG tablet   11  . cephALEXin (KEFLEX) 500 MG capsule Take  1 capsule (500 mg total) by mouth 2 (two) times daily. 20 capsule 0   No facility-administered medications prior to visit.     Allergies  Allergen Reactions  . Ibuprofen     Elevated kidney function    Review of Systems  Constitutional: Negative for fever and malaise/fatigue.  HENT: Negative for congestion.   Eyes: Negative for blurred vision.  Respiratory: Negative for cough and shortness of breath.   Cardiovascular: Negative for chest pain, palpitations and leg swelling.  Gastrointestinal: Negative for vomiting.  Musculoskeletal: Negative for back pain.  Skin: Negative for rash.  Neurological: Negative for loss of consciousness and headaches.       Objective:    Physical Exam  Constitutional: She is oriented to person, place, and time. She appears well-developed and well-nourished.  HENT:  Head: Normocephalic and atraumatic.  Eyes: Conjunctivae and EOM are normal.  Neck: Normal range of motion. Neck supple. No JVD present. Carotid bruit is not present. No thyromegaly present.     Cardiovascular: Normal rate, regular rhythm and normal heart sounds.  No murmur heard. Pulmonary/Chest: Effort normal and breath sounds normal. No respiratory distress. She has no wheezes. She has no rales. She exhibits no tenderness.  Musculoskeletal: She exhibits no edema.  Lymphadenopathy:    She has no cervical adenopathy.  Neurological: She is alert and oriented to person, place, and time.  Psychiatric: She has a normal mood and affect.  Nursing note and vitals reviewed.   BP 133/80   Pulse 77   Temp 98.7 F (37.1 C) (Oral)   Resp 16   Ht 5\' 3"  (1.6 m)   Wt 175 lb (79.4 kg)   LMP 07/22/2017   SpO2 100%   BMI 31.00 kg/m  Wt Readings from Last 3 Encounters:  08/12/17 175 lb (79.4 kg)  07/10/17 177 lb (80.3 kg)  07/01/17 173 lb 3.2 oz (78.6 kg)   BP Readings from Last 3 Encounters:  08/12/17 133/80  07/10/17 120/70  07/01/17 (!) 144/77     Immunization History  Administered Date(s) Administered  . Influenza-Unspecified 11/09/2014  . Tdap 07/01/2017    Health Maintenance  Topic Date Due  . INFLUENZA VACCINE  08/08/2017  . PAP SMEAR  12/08/2017  . TETANUS/TDAP  07/02/2027  . HIV Screening  Completed    Lab Results  Component Value Date   WBC 5.1 07/10/2017   HGB 13.1 07/10/2017   HCT 37.7 07/10/2017   PLT 273.0 07/10/2017   GLUCOSE 96 03/25/2015   CHOL 210 (H) 03/25/2015   TRIG 82 03/25/2015   HDL 65 03/25/2015   LDLCALC 129 03/25/2015   ALT 12 03/25/2015   AST 15 03/25/2015   NA 135 03/25/2015   K 3.7 03/25/2015   CL 100 03/25/2015   CREATININE 0.88 03/25/2015   BUN 12 03/25/2015   CO2 23 03/25/2015   TSH 1.19 03/25/2015    Lab Results  Component Value Date   TSH 1.19 03/25/2015   Lab Results  Component Value Date   WBC 5.1 07/10/2017   HGB 13.1 07/10/2017   HCT 37.7 07/10/2017   MCV 91.0 07/10/2017   PLT 273.0 07/10/2017   Lab Results  Component Value Date   NA 135 03/25/2015   K 3.7 03/25/2015   CO2 23 03/25/2015   GLUCOSE  96 03/25/2015   BUN 12 03/25/2015   CREATININE 0.88 03/25/2015   BILITOT 0.6 03/25/2015   ALKPHOS 68 03/25/2015   AST 15 03/25/2015   ALT 12 03/25/2015  PROT 7.3 03/25/2015   ALBUMIN 4.0 03/25/2015   CALCIUM 9.0 03/25/2015   Lab Results  Component Value Date   CHOL 210 (H) 03/25/2015   Lab Results  Component Value Date   HDL 65 03/25/2015   Lab Results  Component Value Date   LDLCALC 129 03/25/2015   Lab Results  Component Value Date   TRIG 82 03/25/2015   Lab Results  Component Value Date   CHOLHDL 3.2 03/25/2015   No results found for: HGBA1C       Assessment & Plan:   Problem List Items Addressed This Visit    None    Visit Diagnoses    Acute lymphadenitis    -  Primary    improving  rto if worsens or any other symptoms develop   I have discontinued Ashely Enerson's cephALEXin. I am also having her maintain her cholecalciferol, valACYclovir, buPROPion, and FETZIMA.  No orders of the defined types were placed in this encounter.   CMA served as Neurosurgeonscribe during this visit. History, Physical and Plan performed by medical provider. Documentation and orders reviewed and attested to.  Donato SchultzYvonne R Lowne Chase, DO

## 2017-08-30 ENCOUNTER — Telehealth: Payer: BLUE CROSS/BLUE SHIELD | Admitting: Family

## 2017-08-30 DIAGNOSIS — N390 Urinary tract infection, site not specified: Secondary | ICD-10-CM

## 2017-08-30 MED ORDER — CEPHALEXIN 500 MG PO CAPS: 500.0000 mg | ORAL_CAPSULE | Freq: Two times a day (BID) | ORAL | 0 refills | Status: DC

## 2017-08-30 MED FILL — CEPHALEXIN 500 MG CAPSULE: 500 | 7 days supply | Qty: 14 | Fill #0

## 2017-08-30 NOTE — Progress Notes (Signed)
Thank you for the details you included in the comment boxes. Those details are very helpful in determining the best course of treatment for you and help us to provide the best care. See plan below. By the way, cranberry juice won't help. That's a myth unless you drink about 2 gallons, which raises blood sugar.   We are sorry that you are not feeling well.  Here is how we plan to help!  Based on what you shared with me it looks like you most likely have a simple urinary tract infection.  A UTI (Urinary Tract Infection) is a bacterial infection of the bladder.  Most cases of urinary tract infections are simple to treat but a key part of your care is to encourage you to drink plenty of fluids and watch your symptoms carefully.  I have prescribed Keflex 500 mg twice a day for 7 days.  Your symptoms should gradually improve. Call us if the burning in your urine worsens, you develop worsening fever, back pain or pelvic pain or if your symptoms do not resolve after completing the antibiotic.  Urinary tract infections can be prevented by drinking plenty of water to keep your body hydrated.  Also be sure when you wipe, wipe from front to back and don't hold it in!  If possible, empty your bladder every 4 hours.  Your e-visit answers were reviewed by a board certified advanced clinical practitioner to complete your personal care plan.  Depending on the condition, your plan could have included both over the counter or prescription medications.  If there is a problem please reply  once you have received a response from your provider.  Your safety is important to us.  If you have drug allergies check your prescription carefully.    You can use MyChart to ask questions about today's visit, request a non-urgent call back, or ask for a work or school excuse for 24 hours related to this e-Visit. If it has been greater than 24 hours you will need to follow up with your provider, or enter a new e-Visit to address  those concerns.   You will get an e-mail in the next two days asking about your experience.  I hope that your e-visit has been valuable and will speed your recovery. Thank you for using e-visits.

## 2017-09-19 MED FILL — BuPROPion HCL ER (XL) 300 M: 300 | 30 days supply | Qty: 30 | Fill #1

## 2017-09-19 MED FILL — FETZIMA ER 80 MG CAPSULE: 80 | 30 days supply | Qty: 30 | Fill #1

## 2017-10-21 ENCOUNTER — Other Ambulatory Visit: Payer: Self-pay | Admitting: Occupational Medicine

## 2017-10-21 ENCOUNTER — Ambulatory Visit: Payer: Self-pay

## 2017-10-21 DIAGNOSIS — M25562 Pain in left knee: Secondary | ICD-10-CM

## 2017-11-05 MED FILL — FETZIMA ER 80 MG CAPSULE: 80 | 30 days supply | Qty: 30 | Fill #0

## 2017-11-12 MED FILL — BUPROPION HCL XL 300 MG TAB: 300 | 30 days supply | Qty: 30 | Fill #2

## 2017-12-12 MED FILL — FETZIMA ER 80 MG CAPSULE: 80 | 30 days supply | Qty: 30 | Fill #1

## 2017-12-19 ENCOUNTER — Ambulatory Visit: Payer: Self-pay | Admitting: Medical

## 2017-12-24 MED FILL — BUPROPION HCL XL 300 MG TAB: 300 | 30 days supply | Qty: 30 | Fill #3

## 2017-12-30 MED FILL — VALACYCLOVIR HCL 500 MG TAB: 500 | 30 days supply | Qty: 30 | Fill #1

## 2018-01-21 ENCOUNTER — Ambulatory Visit (INDEPENDENT_AMBULATORY_CARE_PROVIDER_SITE_OTHER): Payer: No Typology Code available for payment source | Admitting: Family Medicine

## 2018-01-21 ENCOUNTER — Encounter: Payer: Self-pay | Admitting: Family Medicine

## 2018-01-21 DIAGNOSIS — F329 Major depressive disorder, single episode, unspecified: Secondary | ICD-10-CM | POA: Diagnosis not present

## 2018-01-21 DIAGNOSIS — F32A Depression, unspecified: Secondary | ICD-10-CM

## 2018-01-21 MED ORDER — FETZIMA 80 MG PO CP24: 1.0000 | ORAL_CAPSULE | Freq: Every day | ORAL | 3 refills | Status: DC

## 2018-01-21 MED FILL — FETZIMA ER 80 MG CAPSULE: 80 | 30 days supply | Qty: 30 | Fill #0

## 2018-01-21 NOTE — Patient Instructions (Signed)

## 2018-01-21 NOTE — Progress Notes (Signed)
Patient ID: Crystal Sandoval, female    DOB: Nov 11, 1975  Age: 43 y.o. MRN: [id number]    Subjective:  Subjective  HPI Crystal Sandoval presents for f/u depression.  She is doing well.  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    Review of Systems  Constitutional: Negative for appetite change, diaphoresis, fatigue and unexpected weight change.  Eyes: Negative for pain, redness and visual disturbance.  Respiratory: Negative for cough, chest tightness, shortness of breath and wheezing.   Cardiovascular: Negative for chest pain, palpitations and leg swelling.  Endocrine: Negative for cold intolerance, heat intolerance, polydipsia, polyphagia and polyuria.  Genitourinary: Negative for difficulty urinating, dysuria and frequency.  Neurological: Negative for dizziness, light-headedness, numbness and headaches.  Psychiatric/Behavioral: Negative for decreased concentration and dysphoric mood. The patient is not nervous/anxious.     History Past Medical History:  Diagnosis Date  . Allergy   . Asthma   . Depression   . Frequent headaches   . Migraine     She has a past surgical history that includes Wisdom tooth extraction.   Her family history includes Alcohol abuse in her mother; Breast cancer in her maternal grandmother and paternal grandmother.She reports that she has never smoked. She has never used smokeless tobacco. She reports current alcohol use. No history on file for drug.  Current Outpatient Medications on File Prior to Visit  Medication Sig Dispense Refill  . buPROPion (WELLBUTRIN XL) 300 MG 24 hr tablet TAKE 1 TABLET BY MOUTH ONCE DAILY 90 tablet 3  . cholecalciferol (VITAMIN D) 1000 UNITS tablet Take 1,000 Units by mouth daily.      . valACYclovir (VALTREX) 1000 MG tablet   11   No current facility-administered medications on file prior to visit.      Objective:  Objective  Physical Exam Vitals signs and nursing note reviewed.  Constitutional:       Appearance: She is well-developed.  HENT:     Head: Normocephalic and atraumatic.  Eyes:     Conjunctiva/sclera: Conjunctivae normal.  Neck:     Musculoskeletal: Normal range of motion and neck supple.     Thyroid: No thyromegaly.     Vascular: No carotid bruit or JVD.  Cardiovascular:     Rate and Rhythm: Normal rate and regular rhythm.     Heart sounds: Normal heart sounds. No murmur.  Pulmonary:     Effort: Pulmonary effort is normal. No respiratory distress.     Breath sounds: Normal breath sounds. No wheezing or rales.  Chest:     Chest wall: No tenderness.  Neurological:     Mental Status: She is alert and oriented to person, place, and time.  Psychiatric:        Attention and Perception: Attention and perception normal.        Mood and Affect: Mood normal. Mood is not anxious, depressed or elated. Affect is not labile, blunt, flat, angry, tearful or inappropriate.        Thought Content: Thought content normal.        Cognition and Memory: Cognition and memory normal.        Judgment: Judgment normal.    BP 132/82 (BP Location: Right Arm, Cuff Size: Large)   Pulse 81   Temp 98.2 F (36.8 C) (Oral)   Resp 16   Ht 5\' 3"  (1.6 m)   Wt 179 lb 12.8 oz (81.6 kg)   LMP 01/13/2018   SpO2 98%  BMI 31.85 kg/m  Wt Readings from Last 3 Encounters:  01/21/18 179 lb 12.8 oz (81.6 kg)  08/12/17 175 lb (79.4 kg)  07/10/17 177 lb (80.3 kg)     Lab Results  Component Value Date   WBC 5.1 07/10/2017   HGB 13.1 07/10/2017   HCT 37.7 07/10/2017   PLT 273.0 07/10/2017   GLUCOSE 96 03/25/2015   CHOL 210 (H) 03/25/2015   TRIG 82 03/25/2015   HDL 65 03/25/2015   LDLCALC 129 03/25/2015   ALT 12 03/25/2015   AST 15 03/25/2015   NA 135 03/25/2015   K 3.7 03/25/2015   CL 100 03/25/2015   CREATININE 0.88 03/25/2015   BUN 12 03/25/2015   CO2 23 03/25/2015   TSH 1.19 03/25/2015    Ct Soft Tissue Neck W Contrast  Result Date: 07/13/2017 CLINICAL DATA:  BILATERAL neck  nodes swelling. Symptoms for 3 weeks. EXAM: CT NECK WITH CONTRAST TECHNIQUE: Multidetector CT imaging of the neck was performed using the standard protocol following the bolus administration of intravenous contrast. CONTRAST:  100mL ISOVUE-300 IOPAMIDOL (ISOVUE-300) INJECTION 61% COMPARISON:  None. FINDINGS: Pharynx and larynx: Normal. No mass or swelling. Salivary glands: No inflammation, mass, or stone. Thyroid: Normal. Lymph nodes: BILATERAL relatively symmetric cervical lymphadenopathy, primarily I, II, and V, with the largest lymph node measured 10 mm short axis RIGHT V. Vascular: Patent. Limited intracranial: Negative. Visualized orbits: Negative orbits. Mastoids and visualized paranasal sinuses: Negative sinuses. Skeleton: No worrisome osseous lesion. Upper chest: No pneumothorax or nodule.  No visible adenopathy. Other: None. IMPRESSION: Suspect reactive cervical lymphadenopathy. See discussion above. No pharyngeal masses. Within limits for detection on CT neck, no pharyngeal mucosal lesion. Statistically, these nodes are likely reactive, given their size and distribution, but if there is evidence for superficial lymphadenopathy elsewhere, such as axilla or groin, which is bulky or even larger, or persistence of cervical nodes with interval enlargement, tissue sampling should be considered. Electronically Signed   By: Elsie StainJohn T Curnes M.D.   On: 07/13/2017 11:30     Assessment & Plan:  Plan  I have discontinued Tamikka Buckels's cephALEXin. I am also having her maintain her cholecalciferol, valACYclovir, buPROPion, and FETZIMA.  Meds ordered this encounter  Medications  . FETZIMA 80 MG CP24    Sig: Take 1 capsule by mouth daily.    Dispense:  90 capsule    Refill:  3    Requested drug refills are authorized, however, the patient needs further evaluation and/or laboratory testing before further refills are given. Ask her to make an appointment for this.    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  Donato SchultzYvonne R Lowne Chase, DO    -

## 2018-02-06 ENCOUNTER — Encounter (INDEPENDENT_AMBULATORY_CARE_PROVIDER_SITE_OTHER): Payer: Self-pay

## 2018-02-06 MED FILL — buPROPion HCL ER (XL) 300 M: 300 | 30 days supply | Qty: 30 | Fill #4

## 2018-02-18 ENCOUNTER — Ambulatory Visit (INDEPENDENT_AMBULATORY_CARE_PROVIDER_SITE_OTHER): Payer: Self-pay | Admitting: Bariatrics

## 2018-02-18 ENCOUNTER — Encounter (INDEPENDENT_AMBULATORY_CARE_PROVIDER_SITE_OTHER): Payer: Self-pay | Admitting: Bariatrics

## 2018-02-18 ENCOUNTER — Ambulatory Visit (INDEPENDENT_AMBULATORY_CARE_PROVIDER_SITE_OTHER): Payer: No Typology Code available for payment source | Admitting: Bariatrics

## 2018-02-18 VITALS — BP 146/90 | HR 80 | Temp 97.8°F | Ht 62.0 in | Wt 174.0 lb

## 2018-02-18 DIAGNOSIS — E669 Obesity, unspecified: Secondary | ICD-10-CM

## 2018-02-18 DIAGNOSIS — E559 Vitamin D deficiency, unspecified: Secondary | ICD-10-CM

## 2018-02-18 DIAGNOSIS — Z6832 Body mass index (BMI) 32.0-32.9, adult: Secondary | ICD-10-CM

## 2018-02-18 DIAGNOSIS — Z9189 Other specified personal risk factors, not elsewhere classified: Secondary | ICD-10-CM | POA: Diagnosis not present

## 2018-02-18 DIAGNOSIS — R0602 Shortness of breath: Secondary | ICD-10-CM

## 2018-02-18 DIAGNOSIS — E7849 Other hyperlipidemia: Secondary | ICD-10-CM

## 2018-02-18 DIAGNOSIS — Z1331 Encounter for screening for depression: Secondary | ICD-10-CM

## 2018-02-18 DIAGNOSIS — R5383 Other fatigue: Secondary | ICD-10-CM | POA: Diagnosis not present

## 2018-02-18 DIAGNOSIS — F3289 Other specified depressive episodes: Secondary | ICD-10-CM

## 2018-02-18 DIAGNOSIS — Z0289 Encounter for other administrative examinations: Secondary | ICD-10-CM

## 2018-02-18 NOTE — Progress Notes (Signed)
Office: [phone]  /  Fax: [phone]   Dear Dr. Zola ButtonLowne-Chase,   Thank you for referring Crystal Sandoval to our clinic. The following note includes my evaluation and treatment recommendations.  HPI:   Chief Complaint: OBESITY    Crystal Sophronia SimasBurris has been referred by Crystal SchultzYvonne R. Lowne-Chase, DO for consultation regarding her obesity and obesity related comorbidities.    Crystal Sandoval (MR# [id number]) is a 43 y.o. female who presents on 02/18/2018 for obesity evaluation and treatment. Current BMI is Body mass index is 31.83 kg/m.Marland Kitchen. Crystal Sandoval has been struggling with her weight for many years and has been unsuccessful in either losing weight, maintaining weight loss, or reaching her healthy weight goal.     Crystal Sandoval attended our information session and states she is currently in the action stage of change and ready to dedicate time achieving and maintaining a healthier weight. Crystal Sandoval is interested in becoming our patient and working on intensive lifestyle modifications including (but not limited to) diet, exercise and weight loss.    Crystal Sandoval states her family eats meals together she thinks her family will eat healthier with  her her desired weight loss is 34 lbs she started gaining weight around 30 or 35 yrs of age her heaviest weight ever was 187 lbs. she likes to cook she craves carbohydrates she is frequently drinking liquids with calories she frequently makes poor food choices she struggles with emotional eating  she states that she is lactose intolerant   Fatigue Crystal Sandoval feels her energy is lower than it should be. This has worsened with weight gain and has not worsened recently. Crystal Sandoval admits to daytime somnolence and she admits to waking up still tired. Patient is at risk for obstructive sleep apnea. Patent has a history of symptoms of daytime fatigue, morning fatigue and morning headache. Patient generally gets 3 to 5 hours of sleep per night, and states they generally have restless sleep. Snoring is present.  Apneic episodes are not present. Epworth Sleepiness Score is 15  Dyspnea on exertion Crystal Sandoval notes increasing shortness of breath with certain activities (stairs, exercise) and seems to be worsening over time with weight gain. She notes getting out of breath sooner with activity than she used to. This has not gotten worse recently. [name] denies orthopnea.  Vitamin D deficiency [name] has a diagnosis of vitamin D deficiency. She is currently taking OTC vit D and denies nausea, vomiting or muscle weakness.  At risk for osteopenia and osteoporosis Crystal Sandoval is at higher risk of osteopenia and osteoporosis due to vitamin D deficiency.   Hyperlipidemia Crystal Sandoval has hyperlipidemia and she is not on medications. She is attempting to improve her cholesterol levels with intensive lifestyle modification including a low saturated fat diet, exercise and weight loss. She denies myalgias.  Depression with emotional eating behaviors Crystal Sandoval is struggling with emotional eating and using food for comfort to the extent that it is negatively impacting her health. She often snacks when she is not hungry. Crystal Sandoval sometimes feels she is out of control and then feels guilty that she made poor food choices. Crystal Sandoval is taking Fetzima and Bupropion and she feels controlled. She is attempting to work on behavior modification techniques to help reduce her emotional eating. She shows no sign of suicidal or homicidal ideations.  Depression Screen Crystal Sandoval Food and Mood (modified PHQ-9) score was  Depression screen PHQ 2/9 02/18/2018  Decreased Interest 2  Down, Depressed, Hopeless 1  PHQ - 2 Score 3  Altered sleeping 3  Tired, decreased energy 3  Change in appetite 1  Feeling bad or failure about yourself  1  Trouble concentrating 1  Moving slowly or fidgety/restless 2  Suicidal thoughts 0  PHQ-9 Score 14  Difficult doing work/chores Somewhat difficult    ASSESSMENT AND PLAN:  Other fatigue - Plan: EKG 12-Lead, CBC with  Differential/Platelet, Comprehensive metabolic panel, Hemoglobin A1c, Insulin, random, Vitamin B12, Folate, TSH, T4, free, T3  Shortness of breath on exertion  Vitamin D deficiency - Plan: VITAMIN D 25 Hydroxy (Vit-D Deficiency, Fractures)  Other hyperlipidemia - Plan: Lipid panel  Other depression - with emotional eating  Screening for depression  At risk for osteoporosis  Class 1 obesity with serious comorbidity and body mass index (BMI) of 32.0 to 32.9 in adult, unspecified obesity type  PLAN:  Fatigue Crystal Sandoval was informed that her fatigue may be related to obesity, depression or many other causes. Labs will be ordered, and in the meanwhile Crystal Sandoval has agreed to work on diet, exercise and weight loss to help with fatigue. Proper sleep hygiene was discussed including the need for 7-8 hours of quality sleep each night. A sleep study was not ordered based on symptoms and Epworth score.  Dyspnea on exertion Crystal Sandoval's shortness of breath appears to be obesity related and exercise induced. She has agreed to work on weight loss and gradually increase exercise to treat her exercise induced shortness of breath. If Kalasia follows our instructions and loses weight without improvement of her shortness of breath, we will plan to refer to pulmonology. We will monitor this condition regularly. Crystal Sandoval agrees to this plan.  Vitamin D Deficiency Crystal Sandoval was informed that low vitamin D levels contributes to fatigue and are associated with obesity, breast, and colon cancer. She will continue OTC vitamin D and we will check vitamin D level today. Crystal Sandoval  will follow up for routine testing of vitamin D, at least 2-3 times per year. She was informed of the risk of over-replacement of vitamin D and agrees to not increase her dose unless she discusses this with [place] first.  At risk for osteopenia and osteoporosis Crystal Sandoval was given extended  (15 minutes) osteoporosis prevention counseling today. Crystal Sandoval is at risk for osteopenia and  osteoporosis due to her vitamin D deficiency. She was encouraged to take her vitamin D and follow her higher calcium diet and increase strengthening exercise to help strengthen her bones and decrease her risk of osteopenia and osteoporosis.  Hyperlipidemia Crystal Sandoval was informed of the American Heart Association Guidelines emphasizing intensive lifestyle modifications as the first line treatment for hyperlipidemia. We discussed many lifestyle modifications today in depth, and Crystal Sandoval will start to work on decreasing saturated fats such as fatty red meat, butter and many fried foods. She will also increase vegetables and lean protein in her diet and start to work on exercise and weight loss efforts. We will check lipids today and Crystal Sandoval will follow up with our clinic in 2 weeks.  Depression with Emotional Eating Behaviors We discussed behavior modification techniques today to help Crystal Sandoval deal with her emotional eating and depression. Crystal Sandoval will follow up with our clinic in 2 weeks.  Depression Screen Crystal Sandoval had a moderately positive depression screening. Depression is commonly associated with obesity and often results in emotional eating behaviors. We will monitor this closely and work on CBT to help improve the non-hunger eating patterns. Referral to Psychology may be required if no improvement is seen as she continues in our clinic.  Obesity Crystal Sandoval is currently in the action stage of  change and her goal is to continue with weight loss efforts. I recommend Crystal Sandoval begin the structured treatment plan as follows:  She has agreed to follow the Category 2 plan +100 calories Karigan has been instructed to eventually work up to a goal of 150 minutes of combined cardio and strengthening exercise per week for weight loss and overall health benefits. We discussed the following Behavioral Modification Strategies today: increase H2O intake, no skipping meals, increasing lean protein intake, decreasing simple carbohydrates, increasing  vegetables and work on meal planning and easy cooking plans   She was informed of the importance of frequent follow up visits to maximize her success with intensive lifestyle modifications for her multiple health conditions. She was informed we would discuss her lab results at her next visit unless there is a critical issue that needs to be addressed sooner. Crystal Sandoval agreed to keep her next visit at the agreed upon time to discuss these results.  ALLERGIES: Allergies  Allergen Reactions  . Ibuprofen     Elevated kidney function    MEDICATIONS: Current Outpatient Medications on File Prior to Visit  Medication Sig Dispense Refill  . Biotin 1000 MCG tablet Take 1,000 mcg by mouth 3 (three) times daily.    Marland Kitchen buPROPion (WELLBUTRIN XL) 300 MG 24 hr tablet TAKE 1 TABLET BY MOUTH ONCE DAILY 90 tablet 3  . cholecalciferol (VITAMIN D) 1000 UNITS tablet Take 1,000 Units by mouth daily.      Marland Kitchen FETZIMA 80 MG CP24 Take 1 capsule by mouth daily. 90 capsule 3  . vitamin B-12 (CYANOCOBALAMIN) 500 MCG tablet Take 500 mcg by mouth daily.    . valACYclovir (VALTREX) 1000 MG tablet   11   No current facility-administered medications on file prior to visit.     PAST MEDICAL HISTORY: Past Medical History:  Diagnosis Date  . Allergy   . Asthma   . Constipation   . Depression   . Frequent headaches   . GERD (gastroesophageal reflux disease)   . High cholesterol   . Kidney problem   . Lactose intolerance   . Migraine   . Vitamin D deficiency     PAST SURGICAL HISTORY: Past Surgical History:  Procedure Laterality Date  . WISDOM TOOTH EXTRACTION      SOCIAL HISTORY: Social History   Tobacco Use  . Smoking status: Never Smoker  . Smokeless tobacco: Never Used  Substance Use Topics  . Alcohol use: Yes    Alcohol/week: 0.0 standard drinks    Comment: Occ  . Drug use: Not on file    FAMILY HISTORY: Family History  Problem Relation Age of Onset  . Alcohol abuse Mother   . Drug abuse Mother    . High Cholesterol Mother   . High blood pressure Father   . High Cholesterol Father   . Depression Father   . Breast cancer Maternal Grandmother   . Breast cancer Paternal Grandmother     ROS: Review of Systems  Constitutional: Positive for malaise/fatigue.  Eyes:       + Wear Glasses or Contacts + Floaters  Respiratory: Positive for shortness of breath (on exertion).   Cardiovascular: Negative for orthopnea.  Gastrointestinal: Positive for heartburn.  Musculoskeletal: Negative for myalgias.       + Muscle or Joint Pain  Skin:       + Dryness + Hair or Nail Changes  Neurological: Positive for headaches.  Psychiatric/Behavioral: Positive for depression. Negative for suicidal ideas. The patient has insomnia.        +  Stress    PHYSICAL EXAM: Blood pressure (!) 146/90, pulse 80, temperature 97.8 F (36.6 C), temperature source Oral, height 5\' 2"  (1.575 m), weight 174 lb (78.9 kg), last menstrual period 02/07/2018, SpO2 99 %. Body mass index is 31.83 kg/m. Physical Exam Vitals signs reviewed.  Constitutional:      Appearance: Normal appearance. She is well-developed. She is obese.  HENT:     Head: Normocephalic and atraumatic.     Nose: Nose normal.     Mouth/Throat:     Comments: Mallampati = 4 Eyes:     General: No scleral icterus.    Extraocular Movements: Extraocular movements intact.  Neck:     Musculoskeletal: Normal range of motion and neck supple.     Thyroid: No thyromegaly.  Cardiovascular:     Rate and Rhythm: Normal rate and regular rhythm.  Pulmonary:     Effort: Pulmonary effort is normal. No respiratory distress.  Abdominal:     Palpations: Abdomen is soft.     Tenderness: There is no abdominal tenderness.  Musculoskeletal: Normal range of motion.     Comments: Range of Motion normal in all 4 extremities  Skin:    General: Skin is warm and dry.  Neurological:     Mental Status: She is alert and oriented to person, place, and time.      Coordination: Coordination normal.  Psychiatric:        Mood and Affect: Mood normal.        Behavior: Behavior normal.        Thought Content: Thought content does not include homicidal or suicidal ideation.     RECENT LABS AND TESTS: BMET    Component Value Date/Time   NA 135 03/25/2015 1545   K 3.7 03/25/2015 1545   CL 100 03/25/2015 1545   CO2 23 03/25/2015 1545   GLUCOSE 96 03/25/2015 1545   BUN 12 03/25/2015 1545   CREATININE 0.88 03/25/2015 1545   CALCIUM 9.0 03/25/2015 1545   No results found for: HGBA1C No results found for: INSULIN CBC    Component Value Date/Time   WBC 5.1 07/10/2017 1308   RBC 4.14 07/10/2017 1308   HGB 13.1 07/10/2017 1308   HCT 37.7 07/10/2017 1308   PLT 273.0 07/10/2017 1308   MCV 91.0 07/10/2017 1308   MCH 30.9 03/25/2015 1545   MCHC 34.6 07/10/2017 1308   RDW 13.1 07/10/2017 1308   LYMPHSABS 1.8 07/10/2017 1308   MONOABS 0.6 07/10/2017 1308   EOSABS 0.0 07/10/2017 1308   BASOSABS 0.1 07/10/2017 1308   Iron/TIBC/Ferritin/ %Sat No results found for: IRON, TIBC, FERRITIN, IRONPCTSAT Lipid Panel     Component Value Date/Time   CHOL 210 (H) 03/25/2015 1545   TRIG 82 03/25/2015 1545   HDL 65 03/25/2015 1545   CHOLHDL 3.2 03/25/2015 1545   VLDL 16 03/25/2015 1545   LDLCALC 129 03/25/2015 1545   Hepatic Function Panel     Component Value Date/Time   PROT 7.3 03/25/2015 1545   ALBUMIN 4.0 03/25/2015 1545   AST 15 03/25/2015 1545   ALT 12 03/25/2015 1545   ALKPHOS 68 03/25/2015 1545   BILITOT 0.6 03/25/2015 1545      Component Value Date/Time   TSH 1.19 03/25/2015 1545    ECG  shows NSR with a rate of 85 BPM INDIRECT CALORIMETER done today shows a VO2 of 249 and a REE of 1733.  Her calculated basal metabolic rate is 0865 thus her basal metabolic rate is better  than expected.       OBESITY BEHAVIORAL INTERVENTION VISIT  Today's visit was # 1   Starting weight: 174 lbs Starting date: 02/18/2018 Today's weight : 174  lbs Today's date: 02/18/2018 Total lbs lost to date: 0   ASK: We discussed the diagnosis of obesity with Crystal Ravens today and Arelyn agreed to give [place] permission to discuss obesity behavioral modification therapy today.  ASSESS: Crystal Sandoval has the diagnosis of obesity and her BMI today is 31.82 Tashai is in the action stage of change   ADVISE: Crystal Sandoval was educated on the multiple health risks of obesity as well as the benefit of weight loss to improve her health. She was advised of the need for long term treatment and the importance of lifestyle modifications to improve her current health and to decrease her risk of future health problems.  AGREE: Multiple dietary modification options and treatment options were discussed and  Crystal Sandoval agreed to follow the recommendations documented in the above note.  ARRANGE: Crystal Sandoval was educated on the importance of frequent visits to treat obesity as outlined per CMS and USPSTF guidelines and agreed to schedule her next follow up appointment today.  Cristi Loron, am acting as Energy manager for [employer]. Manson Passey, DO  I have reviewed the above documentation for accuracy and completeness, and I agree with the above. -Corinna Capra, DO

## 2018-02-19 DIAGNOSIS — Z6830 Body mass index (BMI) 30.0-30.9, adult: Secondary | ICD-10-CM

## 2018-02-19 DIAGNOSIS — E669 Obesity, unspecified: Secondary | ICD-10-CM | POA: Insufficient documentation

## 2018-02-20 LAB — COMPREHENSIVE METABOLIC PANEL WITH GFR
ALT: 13 [IU]/L (ref 0–32)
AST: 20 [IU]/L (ref 0–40)
Albumin/Globulin Ratio: 1.6 (ref 1.2–2.2)
Albumin: 4.5 g/dL (ref 3.8–4.8)
Alkaline Phosphatase: 104 [IU]/L (ref 39–117)
BUN/Creatinine Ratio: 12 (ref 9–23)
BUN: 13 mg/dL (ref 6–24)
Bilirubin Total: 0.4 mg/dL (ref 0.0–1.2)
CO2: 18 mmol/L — ABNORMAL LOW (ref 20–29)
Calcium: 9.3 mg/dL (ref 8.7–10.2)
Chloride: 99 mmol/L (ref 96–106)
Creatinine, Ser: 1.09 mg/dL — ABNORMAL HIGH (ref 0.57–1.00)
GFR calc Af Amer: 72 mL/min/{1.73_m2}
GFR calc non Af Amer: 63 mL/min/{1.73_m2}
Globulin, Total: 2.8 g/dL (ref 1.5–4.5)
Glucose: 85 mg/dL (ref 65–99)
Potassium: 4.4 mmol/L (ref 3.5–5.2)
Sodium: 137 mmol/L (ref 134–144)
Total Protein: 7.3 g/dL (ref 6.0–8.5)

## 2018-02-20 LAB — CBC WITH DIFFERENTIAL/PLATELET
BASOS ABS: 0 10*3/uL (ref 0.0–0.2)
BASOS: 1 %
EOS (ABSOLUTE): 0.1 10*3/uL (ref 0.0–0.4)
Eos: 2 %
HEMATOCRIT: 38.2 % (ref 34.0–46.6)
HEMOGLOBIN: 13.4 g/dL (ref 11.1–15.9)
IMMATURE GRANS (ABS): 0 10*3/uL (ref 0.0–0.1)
Immature Granulocytes: 0 %
Lymphocytes Absolute: 1.6 10*3/uL (ref 0.7–3.1)
Lymphs: 46 %
MCH: 30.2 pg (ref 26.6–33.0)
MCHC: 35.1 g/dL (ref 31.5–35.7)
MCV: 86 fL (ref 79–97)
Monocytes Absolute: 0.4 10*3/uL (ref 0.1–0.9)
Monocytes: 12 %
NEUTROS PCT: 39 %
Neutrophils Absolute: 1.4 10*3/uL (ref 1.4–7.0)
Platelets: 310 10*3/uL (ref 150–450)
RBC: 4.43 x10E6/uL (ref 3.77–5.28)
RDW: 12.2 % (ref 11.7–15.4)
WBC: 3.5 10*3/uL (ref 3.4–10.8)

## 2018-02-20 LAB — HEMOGLOBIN A1C
Est. average glucose Bld gHb Est-mCnc: 117 mg/dL
Hgb A1c MFr Bld: 5.7 % — ABNORMAL HIGH (ref 4.8–5.6)

## 2018-02-20 LAB — LIPID PANEL
Chol/HDL Ratio: 3.6 ratio (ref 0.0–4.4)
Cholesterol, Total: 238 mg/dL — ABNORMAL HIGH (ref 100–199)
HDL: 67 mg/dL (ref >39)
LDL Calculated: 157 mg/dL — ABNORMAL HIGH (ref 0–99)
Triglycerides: 68 mg/dL (ref 0–149)
VLDL Cholesterol Cal: 14 mg/dL (ref 5–40)

## 2018-02-20 LAB — T4, FREE: Free T4: 1.24 ng/dL (ref 0.82–1.77)

## 2018-02-20 LAB — T3: T3, Total: 112 ng/dL (ref 71–180)

## 2018-02-20 LAB — INSULIN, RANDOM: INSULIN: 12.1 u[IU]/mL (ref 2.6–24.9)

## 2018-02-20 LAB — VITAMIN B12: Vitamin B-12: 1489 pg/mL — ABNORMAL HIGH (ref 232–1245)

## 2018-02-20 LAB — TSH: TSH: 1.12 u[IU]/mL (ref 0.450–4.500)

## 2018-02-20 LAB — FOLATE: Folate: 9.2 ng/mL (ref >3.0)

## 2018-02-20 LAB — VITAMIN D 25 HYDROXY (VIT D DEFICIENCY, FRACTURES): Vit D, 25-Hydroxy: 47.7 ng/mL (ref 30.0–100.0)

## 2018-03-04 MED FILL — FETZIMA ER 80 MG CAPSULE: 80 | 30 days supply | Qty: 30 | Fill #1

## 2018-03-11 ENCOUNTER — Ambulatory Visit (INDEPENDENT_AMBULATORY_CARE_PROVIDER_SITE_OTHER): Payer: No Typology Code available for payment source | Admitting: Bariatrics

## 2018-03-11 ENCOUNTER — Encounter (INDEPENDENT_AMBULATORY_CARE_PROVIDER_SITE_OTHER): Payer: Self-pay | Admitting: Bariatrics

## 2018-03-11 VITALS — BP 139/87 | HR 79 | Temp 97.9°F | Ht 62.0 in | Wt 171.0 lb

## 2018-03-11 DIAGNOSIS — E559 Vitamin D deficiency, unspecified: Secondary | ICD-10-CM

## 2018-03-11 DIAGNOSIS — E669 Obesity, unspecified: Secondary | ICD-10-CM

## 2018-03-11 DIAGNOSIS — Z9189 Other specified personal risk factors, not elsewhere classified: Secondary | ICD-10-CM

## 2018-03-11 DIAGNOSIS — R7303 Prediabetes: Secondary | ICD-10-CM

## 2018-03-11 DIAGNOSIS — E7800 Pure hypercholesterolemia, unspecified: Secondary | ICD-10-CM

## 2018-03-11 DIAGNOSIS — Z6831 Body mass index (BMI) 31.0-31.9, adult: Secondary | ICD-10-CM

## 2018-03-11 NOTE — Progress Notes (Signed)
Office: [phone]  /  Fax: [phone]   HPI:   Chief Complaint: OBESITY Crystal Sandoval is here to discuss her progress with her obesity treatment plan. She is on the Category 2 plan + 100 calories and is following her eating plan approximately 80-90% of the time. She states she is exercising 0 minutes 0 times per week. Crystal Sandoval liked that "there was plenty of food." She was not hungry during this time and did not report having any unusual cravings. Her weight is 171 lb (77.6 kg) today and has had a weight loss of 3 pounds over a period of 3 weeks since her last visit. She has lost 3 lbs since starting treatment with [place].  Pre-Diabetes Crystal Sandoval has a diagnosis of prediabetes based on her elevated Hgb A1c of 5.7 and an insulin level of 12.1 on 02/18/2018 and was informed this puts her at greater risk of developing diabetes. She is not taking metformin currently and continues to work on diet and exercise to decrease risk of diabetes. She denies polyphagia.  At risk for diabetes Crystal Sandoval is at higher than averagerisk for developing diabetes due to her obesity. She currently denies polyuria or polydipsia.  Vitamin D deficiency Crystal Sandoval has a diagnosis of Vitamin D deficiency with a level of 47.7 on 02/18/2018. She is currently not taking prescription Vit D and denies nausea, vomiting or muscle weakness.  Elevated Cholesterol Crystal Sandoval had an elevated LDL level of 157 and an elevated total cholesterol of 238 reported on 02/18/2018.  ASSESSMENT AND PLAN:  Prediabetes  Vitamin D deficiency  Elevated cholesterol  At risk for diabetes mellitus  Class 1 obesity with serious comorbidity and body mass index (BMI) of 31.0 to 31.9 in adult, unspecified obesity type  PLAN:  Pre-Diabetes Crystal Sandoval will continue to work on weight loss, exercise, and decreasing simple carbohydrates in her diet to help decrease the risk of diabetes. We dicussed metformin including benefits and risks. She was informed that eating too many  simple carbohydrates or too many calories at one sitting increases the likelihood of GI side effects. Crystal Sandoval is not on metformin for now and a prescription was not written today. She was advised to decrease carbohydrates and increase protein. Crystal Sandoval agreed to follow-up with [place] as directed to monitor her progress.  Diabetes risk counseling Crystal Sandoval was given extended (15 minutes) diabetes prevention counseling today. She is 43 y.o. female and has risk factors for diabetes including obesity. We discussed intensive lifestyle modifications today with an emphasis on weight loss as well as increasing exercise and decreasing simple carbohydrates in her diet.  Vitamin D Deficiency Crystal Sandoval was informed that low Vitamin D levels contributes to fatigue and are associated with obesity, breast, and colon cancer. She agrees to begin taking OTC Vit D @ 5,000 IU and will follow-up for routine testing of Vitamin D, at least 2-3 times per year. She was informed of the risk of over-replacement of Vitamin D and agrees to not increase her dose unless she discusses this with [place] first. Crystal Sandoval agrees to follow-up with our clinic in 2 weeks.  Elevated Cholesterol We recommended Crystal Sandoval decrease saturated fats and eat more MUFAS and PUFAS.  Obesity Crystal Sandoval is currently in the action stage of change. As such, her goal is to continue with weight loss efforts. She has agreed to follow the Category 2 plan + 100 calories. Crystal Sandoval will space out her protein and work on meal planning. Crystal Sandoval has been instructed to work up to a goal of 150 minutes of  combined cardio and strengthening exercise per week for weight loss and overall health benefits. We discussed the following Behavioral Modification Strategies today: increasing lean protein intake, decreasing simple carbohydrates, increasing vegetables, increase H20 intake, decrease liquid calories, decreasing eating out, no skipping meals, work on meal planning and easy cooking plans, and keeping healthy  foods in the home.  Crystal Sandoval has agreed to follow-up with our clinic in 2 weeks. She was informed of the importance of frequent follow up visits to maximize her success with intensive lifestyle modifications for her multiple health conditions.  ALLERGIES: Allergies  Allergen Reactions  . Ibuprofen     Elevated kidney function    MEDICATIONS: Current Outpatient Medications on File Prior to Visit  Medication Sig Dispense Refill  . Biotin 1000 MCG tablet Take 1,000 mcg by mouth 3 (three) times daily.    Marland Kitchen buPROPion (WELLBUTRIN XL) 300 MG 24 hr tablet TAKE 1 TABLET BY MOUTH ONCE DAILY 90 tablet 3  . cholecalciferol (VITAMIN D) 1000 UNITS tablet Take 1,000 Units by mouth daily.      Marland Kitchen FETZIMA 80 MG CP24 Take 1 capsule by mouth daily. 90 capsule 3  . valACYclovir (VALTREX) 1000 MG tablet   11  . vitamin B-12 (CYANOCOBALAMIN) 500 MCG tablet Take 500 mcg by mouth daily.     No current facility-administered medications on file prior to visit.     PAST MEDICAL HISTORY: Past Medical History:  Diagnosis Date  . Allergy   . Asthma   . Constipation   . Depression   . Frequent headaches   . GERD (gastroesophageal reflux disease)   . High cholesterol   . Kidney problem   . Lactose intolerance   . Migraine   . Vitamin D deficiency     PAST SURGICAL HISTORY: Past Surgical History:  Procedure Laterality Date  . WISDOM TOOTH EXTRACTION      SOCIAL HISTORY: Social History   Tobacco Use  . Smoking status: Never Smoker  . Smokeless tobacco: Never Used  Substance Use Topics  . Alcohol use: Yes    Alcohol/week: 0.0 standard drinks    Comment: Occ  . Drug use: Not on file    FAMILY HISTORY: Family History  Problem Relation Age of Onset  . Alcohol abuse Mother   . Drug abuse Mother   . High Cholesterol Mother   . High blood pressure Father   . High Cholesterol Father   . Depression Father   . Breast cancer Maternal Grandmother   . Breast cancer Paternal Grandmother     ROS: Review of Systems  Constitutional: Positive for weight loss.  Gastrointestinal: Negative for nausea and vomiting.  Musculoskeletal:       Negative for muscle weakness.  Endo/Heme/Allergies:       Negative for polyphagia. Negative for hypoglycemia.   PHYSICAL EXAM: Blood pressure 139/87, pulse 79, temperature 97.9 F (36.6 C), temperature source Oral, height  (1.575 m), weight 171 lb (77.6 kg), last menstrual period 03/11/2018, SpO2 100 %. Body mass index is 31.28 kg/m. Physical Exam Vitals signs reviewed.  Constitutional:      Appearance: Normal appearance. She is obese.  Cardiovascular:     Rate and Rhythm: Normal rate.     Pulses: Normal pulses.  Pulmonary:     Effort: Pulmonary effort is normal.     Breath sounds: Normal breath sounds.  Musculoskeletal: Normal range of motion.  Skin:    General: Skin is warm and dry.  Neurological:  Mental Status: She is alert and oriented to person, place, and time.  Psychiatric:        Behavior: Behavior normal.   RECENT LABS AND TESTS: BMET    Component Value Date/Time   NA 137 02/18/2018 0926   K 4.4 02/18/2018 0926   CL 99 02/18/2018 0926   CO2 18 (L) 02/18/2018 0926   GLUCOSE 85 02/18/2018 0926   GLUCOSE 96 03/25/2015 1545   BUN 13 02/18/2018 0926   CREATININE 1.09 (H) 02/18/2018 0926   CREATININE 0.88 03/25/2015 1545   CALCIUM 9.3 02/18/2018 0926   GFRNONAA 63 02/18/2018 0926   GFRAA 72 02/18/2018 0926   Lab Results  Component Value Date   HGBA1C 5.7 (H) 02/18/2018   Lab Results  Component Value Date   INSULIN 12.1 02/18/2018   CBC    Component Value Date/Time   WBC 3.5 02/18/2018 0926   WBC 5.1 07/10/2017 1308   RBC 4.43 02/18/2018 0926   RBC 4.14 07/10/2017 1308   HGB 13.4 02/18/2018 0926   HCT 38.2 02/18/2018 0926   PLT 310 02/18/2018 0926   MCV 86 02/18/2018 0926   MCH 30.2 02/18/2018 0926   MCH 30.9 03/25/2015 1545   MCHC 35.1 02/18/2018 0926   MCHC 34.6 07/10/2017 1308   RDW  12.2 02/18/2018 0926   LYMPHSABS 1.6 02/18/2018 0926   MONOABS 0.6 07/10/2017 1308   EOSABS 0.1 02/18/2018 0926   BASOSABS 0.0 02/18/2018 0926   Iron/TIBC/Ferritin/ %Sat No results found for: IRON, TIBC, FERRITIN, IRONPCTSAT Lipid Panel     Component Value Date/Time   CHOL 238 (H) 02/18/2018 0926   TRIG 68 02/18/2018 0926   HDL 67 02/18/2018 0926   CHOLHDL 3.6 02/18/2018 0926   CHOLHDL 3.2 03/25/2015 1545   VLDL 16 03/25/2015 1545   LDLCALC 157 (H) 02/18/2018 0926   Hepatic Function Panel     Component Value Date/Time   PROT 7.3 02/18/2018 0926   ALBUMIN 4.5 02/18/2018 0926   AST 20 02/18/2018 0926   ALT 13 02/18/2018 0926   ALKPHOS 104 02/18/2018 0926   BILITOT 0.4 02/18/2018 0926      Component Value Date/Time   TSH 1.120 02/18/2018 0926   TSH 1.19 03/25/2015 1545    Ref. Range 02/18/2018 09:26  Vitamin D, 25-Hydroxy Latest Ref Range: 30.0 - 100.0 ng/mL 47.7   OBESITY BEHAVIORAL INTERVENTION VISIT  Today's visit was #2   Starting weight: 174 lbs Starting date: 02/18/2018 Today's weight: 171 lbs  Today's date: 03/11/2018 Total lbs lost to date: 3    03/11/2018  Height 5\' 2"  (1.575 m)  Weight 171 lb (77.6 kg)  BMI (Calculated) 31.27  BLOOD PRESSURE - SYSTOLIC 139  BLOOD PRESSURE - DIASTOLIC 87   Body Fat % 36.6 %  Total Body Water (lbs) 70.6 lbs   ASK: We discussed the diagnosis of obesity with Crystal Sandoval today and Crystal Sandoval agreed to give [place] permission to discuss obesity behavioral modification therapy today.  ASSESS: Crystal Sandoval has the diagnosis of obesity and her BMI today is 31.27. Crystal Sandoval is in the action stage of change.   ADVISE: Crystal Sandoval was educated on the multiple health risks of obesity as well as the benefit of weight loss to improve her health. She was advised of the need for long term treatment and the importance of lifestyle modifications to improve her current health and to decrease her risk of future health problems.  AGREE: Multiple dietary modification  options and treatment options were discussed and  Crystal Sandoval agreed to follow the recommendations documented in the above note.  ARRANGE: Crystal Sandoval was educated on the importance of frequent visits to treat obesity as outlined per CMS and USPSTF guidelines and agreed to schedule her next follow up appointment today.  Fernanda Drum, am acting as Energy manager for [employer], DO  I have reviewed the above documentation for accuracy and completeness, and I agree with the above. -Corinna Capra, DO

## 2018-03-13 DIAGNOSIS — L820 Inflamed seborrheic keratosis: Secondary | ICD-10-CM | POA: Diagnosis not present

## 2018-03-13 DIAGNOSIS — L821 Other seborrheic keratosis: Secondary | ICD-10-CM | POA: Diagnosis not present

## 2018-03-21 MED FILL — buPROPion HCL ER (XL) 300 M: 300 | 30 days supply | Qty: 30 | Fill #0 | Status: TO

## 2018-03-27 ENCOUNTER — Other Ambulatory Visit: Payer: Self-pay

## 2018-03-27 ENCOUNTER — Ambulatory Visit (INDEPENDENT_AMBULATORY_CARE_PROVIDER_SITE_OTHER): Payer: No Typology Code available for payment source | Admitting: Bariatrics

## 2018-03-27 ENCOUNTER — Encounter (INDEPENDENT_AMBULATORY_CARE_PROVIDER_SITE_OTHER): Payer: Self-pay | Admitting: Bariatrics

## 2018-03-27 VITALS — BP 133/86 | HR 83 | Temp 98.4°F | Ht 62.0 in | Wt 168.0 lb

## 2018-03-27 DIAGNOSIS — E559 Vitamin D deficiency, unspecified: Secondary | ICD-10-CM | POA: Diagnosis not present

## 2018-03-27 DIAGNOSIS — Z9189 Other specified personal risk factors, not elsewhere classified: Secondary | ICD-10-CM

## 2018-03-27 DIAGNOSIS — R7303 Prediabetes: Secondary | ICD-10-CM

## 2018-03-27 DIAGNOSIS — Z711 Person with feared health complaint in whom no diagnosis is made: Secondary | ICD-10-CM

## 2018-03-27 DIAGNOSIS — E669 Obesity, unspecified: Secondary | ICD-10-CM

## 2018-03-27 DIAGNOSIS — Z6830 Body mass index (BMI) 30.0-30.9, adult: Secondary | ICD-10-CM

## 2018-03-27 MED FILL — VALACYCLOVIR HCL 500 MG TAB: 500 | 30 days supply | Qty: 30 | Fill #2

## 2018-03-27 NOTE — Progress Notes (Signed)
Office: [phone]  /  Fax: [phone]   HPI:   Chief Complaint: OBESITY Crystal Sandoval is here to discuss her progress with her obesity treatment plan. She is on the Category 2 plan +100 calories and is following her eating plan approximately 90 % of the time. She states she is exercising 0 minutes 0 times per week. Crystal Sandoval is getting bored with the plan and it was hard for her to get in all of the food. Her weight is 168 lb (76.2 kg) today and has had a weight loss of 3 pounds over a period of 2 weeks since her last visit. She has lost 6 lbs since starting treatment with [place].  Pre-Diabetes Crystal Sandoval has a diagnosis of prediabetes based on her elevated Hgb A1c and was informed this puts her at greater risk of developing diabetes. She is not on medications currently. Crystal Sandoval continues to work on diet and exercise to decrease risk of diabetes. She denies polyphagia.  Vitamin D deficiency Crystal Sandoval has a diagnosis of vitamin D deficiency. Her last vitamin D level was at 47.7 She is taking OTC vit D and denies nausea, vomiting or muscle weakness.  At risk for osteopenia and osteoporosis Crystal Sandoval is at higher risk of osteopenia and osteoporosis due to vitamin D deficiency.   Worried Well Crystal Sandoval has an increase in cravings (trying to increase protein).  ASSESSMENT AND PLAN:  Prediabetes  Vitamin D deficiency  Worried well  At risk for osteoporosis  Class 1 obesity with serious comorbidity and body mass index (BMI) of 30.0 to 30.9 in adult, unspecified obesity type  PLAN:  Pre-Diabetes Crystal Sandoval will continue to work on weight loss, exercise, increasing lean protein and decreasing simple carbohydrates in her diet to help decrease the risk of diabetes. She was informed that eating too many simple carbohydrates or too many calories at one sitting increases the likelihood of GI side effects. [name] agreed to follow up with [place] as directed to monitor her progress.  Vitamin D Deficiency Crystal Sandoval was informed that low  vitamin D levels contributes to fatigue and are associated with obesity, breast, and colon cancer. She agrees to take OTC Vit D ,000 IU daily and will follow up for routine testing of vitamin D, at least 2-3 times per year. She was informed of the risk of over-replacement of vitamin D and agrees to not increase her dose unless she discusses this with [place] first. Crystal Sandoval agrees to follow up as directed.  At risk for osteopenia and osteoporosis Crystal Sandoval was given extended  (15 minutes) osteoporosis prevention counseling today. Crystal Sandoval is at risk for osteopenia and osteoporosis due to her vitamin D deficiency. She was encouraged to take her vitamin D and follow her higher calcium diet and increase strengthening exercise to help strengthen her bones and decrease her risk of osteopenia and osteoporosis.  Worried Well Pt was educated on the need for social distancing, no travel and hand washing/hand sanitizing. We discussed symptoms of COVID19 and we discussed the possible need for sequestration and how to deal with this if it happens. We discussed CBT techniques. Crystal Sandoval will keep increasing her meat. Pt offered guidance and reassurance.  Obesity Crystal Sandoval is currently in the action stage of change. As such, her goal is to continue with weight loss efforts She has agreed to follow the Category 2 plan +100 calories Crystal Sandoval has been instructed to work up to a goal of 150 minutes of combined cardio and strengthening exercise per week for weight loss and overall health benefits.  We discussed the following Behavioral Modification Strategies today: increase H2O intake, no skipping meals, keeping healthy foods in the home, increasing lean protein intake, decreasing simple carbohydrates, increasing vegetables, decrease eating out and work on meal planning and easy cooking plans  Crystal Sandoval has agreed to follow up with our clinic in 2 weeks. She was informed of the importance of frequent follow up visits to maximize her success with  intensive lifestyle modifications for her multiple health conditions.  ALLERGIES: Allergies  Allergen Reactions  . Ibuprofen     Elevated kidney function    MEDICATIONS: Current Outpatient Medications on File Prior to Visit  Medication Sig Dispense Refill  . Biotin 1000 MCG tablet Take 1,000 mcg by mouth 3 (three) times daily.    Marland Kitchen buPROPion (WELLBUTRIN XL) 300 MG 24 hr tablet TAKE 1 TABLET BY MOUTH ONCE DAILY 90 tablet 3  . cholecalciferol (VITAMIN D) 1000 UNITS tablet Take 1,000 Units by mouth daily.      Marland Kitchen FETZIMA 80 MG CP24 Take 1 capsule by mouth daily. 90 capsule 3  . valACYclovir (VALTREX) 1000 MG tablet   11  . vitamin B-12 (CYANOCOBALAMIN) 500 MCG tablet Take 500 mcg by mouth daily.     No current facility-administered medications on file prior to visit.     PAST MEDICAL HISTORY: Past Medical History:  Diagnosis Date  . Allergy   . Asthma   . Constipation   . Depression   . Frequent headaches   . GERD (gastroesophageal reflux disease)   . High cholesterol   . Kidney problem   . Lactose intolerance   . Migraine   . Vitamin D deficiency     PAST SURGICAL HISTORY: Past Surgical History:  Procedure Laterality Date  . WISDOM TOOTH EXTRACTION      SOCIAL HISTORY: Social History   Tobacco Use  . Smoking status: Never Smoker  . Smokeless tobacco: Never Used  Substance Use Topics  . Alcohol use: Yes    Alcohol/week: 0.0 standard drinks    Comment: Occ  . Drug use: Not on file    FAMILY HISTORY: Family History  Problem Relation Age of Onset  . Alcohol abuse Mother   . Drug abuse Mother   . High Cholesterol Mother   . High blood pressure Father   . High Cholesterol Father   . Depression Father   . Breast cancer Maternal Grandmother   . Breast cancer Paternal Grandmother     ROS: Review of Systems  Constitutional: Positive for weight loss.  Gastrointestinal: Negative for nausea and vomiting.  Musculoskeletal:       Negative for muscle weakness   Endo/Heme/Allergies:       Negative for polyphagia Positive for cravings    PHYSICAL EXAM: Blood pressure 133/86, pulse 83, temperature 98.4 F (36.9 C), height  (1.575 m), weight 168 lb (76.2 kg), last menstrual period 03/11/2018, SpO2 97 %. Body mass index is 30.73 kg/m. Physical Exam Vitals signs reviewed.  Constitutional:      Appearance: Normal appearance. She is well-developed. She is obese.  Cardiovascular:     Rate and Rhythm: Normal rate.  Pulmonary:     Effort: Pulmonary effort is normal.  Musculoskeletal: Normal range of motion.  Skin:    General: Skin is warm and dry.  Neurological:     Mental Status: She is alert and oriented to person, place, and time.  Psychiatric:        Mood and Affect: Mood normal.  Behavior: Behavior normal.     RECENT LABS AND TESTS: BMET    Component Value Date/Time   NA 137 02/18/2018 0926   K 4.4 02/18/2018 0926   CL 99 02/18/2018 0926   CO2 18 (L) 02/18/2018 0926   GLUCOSE 85 02/18/2018 0926   GLUCOSE 96 03/25/2015 1545   BUN 13 02/18/2018 0926   CREATININE 1.09 (H) 02/18/2018 0926   CREATININE 0.88 03/25/2015 1545   CALCIUM 9.3 02/18/2018 0926   GFRNONAA 63 02/18/2018 0926   GFRAA 72 02/18/2018 0926   Lab Results  Component Value Date   HGBA1C 5.7 (H) 02/18/2018   Lab Results  Component Value Date   INSULIN 12.1 02/18/2018   CBC    Component Value Date/Time   WBC 3.5 02/18/2018 0926   WBC 5.1 07/10/2017 1308   RBC 4.43 02/18/2018 0926   RBC 4.14 07/10/2017 1308   HGB 13.4 02/18/2018 0926   HCT 38.2 02/18/2018 0926   PLT 310 02/18/2018 0926   MCV 86 02/18/2018 0926   MCH 30.2 02/18/2018 0926   MCH 30.9 03/25/2015 1545   MCHC 35.1 02/18/2018 0926   MCHC 34.6 07/10/2017 1308   RDW 12.2 02/18/2018 0926   LYMPHSABS 1.6 02/18/2018 0926   MONOABS 0.6 07/10/2017 1308   EOSABS 0.1 02/18/2018 0926   BASOSABS 0.0 02/18/2018 0926   Iron/TIBC/Ferritin/ %Sat No results found for: IRON, TIBC, FERRITIN,  IRONPCTSAT Lipid Panel     Component Value Date/Time   CHOL 238 (H) 02/18/2018 0926   TRIG 68 02/18/2018 0926   HDL 67 02/18/2018 0926   CHOLHDL 3.6 02/18/2018 0926   CHOLHDL 3.2 03/25/2015 1545   VLDL 16 03/25/2015 1545   LDLCALC 157 (H) 02/18/2018 0926   Hepatic Function Panel     Component Value Date/Time   PROT 7.3 02/18/2018 0926   ALBUMIN 4.5 02/18/2018 0926   AST 20 02/18/2018 0926   ALT 13 02/18/2018 0926   ALKPHOS 104 02/18/2018 0926   BILITOT 0.4 02/18/2018 0926      Component Value Date/Time   TSH 1.120 02/18/2018 0926   TSH 1.19 03/25/2015 1545   Results for Crystal, Sandoval (MRN [id number]) as of 03/27/2018 14:17  Ref. Range 02/18/2018 09:26  Vitamin D, 25-Hydroxy Latest Ref Range: 30.0 - 100.0 ng/mL 47.7     OBESITY BEHAVIORAL INTERVENTION VISIT  Today's visit was # 3   Starting weight: 174 lbs Starting date: 02/18/2018 Today's weight : 168 lbs Today's date: 03/27/2018 Total lbs lost to date: 6    03/27/2018  Height 5\' 2"  (1.575 m)  Weight 168 lb (76.2 kg)  BMI (Calculated) 30.72  BLOOD PRESSURE - SYSTOLIC 133  BLOOD PRESSURE - DIASTOLIC 86   Body Fat % 35.9 %  Total Body Water (lbs) 69.6 lbs    ASK: We discussed the diagnosis of obesity with Crystal Sandoval today and Crystal Sandoval agreed to give [place] permission to discuss obesity behavioral modification therapy today.  ASSESS: Crystal Sandoval has the diagnosis of obesity and her BMI today is 30.72 Crystal Sandoval is in the action stage of change   ADVISE: Crystal Sandoval was educated on the multiple health risks of obesity as well as the benefit of weight loss to improve her health. She was advised of the need for long term treatment and the importance of lifestyle modifications to improve her current health and to decrease her risk of future health problems.  AGREE: Multiple dietary modification options and treatment options were discussed and  Crystal Sandoval agreed to follow the recommendations  documented in the above note.  ARRANGE: Crystal Sandoval was  educated on the importance of frequent visits to treat obesity as outlined per CMS and USPSTF guidelines and agreed to schedule her next follow up appointment today.  Crystal Sandoval, am acting as Energy manager for [employer]. Manson Passey, DO  I have reviewed the above documentation for accuracy and completeness, and I agree with the above. -Crystal Capra, DO

## 2018-03-28 MED FILL — FETZIMA ER 80 MG CAPSULE: 80 | 30 days supply | Qty: 30 | Fill #2

## 2018-04-01 ENCOUNTER — Encounter (INDEPENDENT_AMBULATORY_CARE_PROVIDER_SITE_OTHER): Payer: Self-pay

## 2018-04-15 ENCOUNTER — Encounter (INDEPENDENT_AMBULATORY_CARE_PROVIDER_SITE_OTHER): Payer: Self-pay | Admitting: Bariatrics

## 2018-04-15 ENCOUNTER — Ambulatory Visit (INDEPENDENT_AMBULATORY_CARE_PROVIDER_SITE_OTHER): Payer: No Typology Code available for payment source | Admitting: Bariatrics

## 2018-04-15 ENCOUNTER — Other Ambulatory Visit: Payer: Self-pay

## 2018-04-15 DIAGNOSIS — E669 Obesity, unspecified: Secondary | ICD-10-CM | POA: Diagnosis not present

## 2018-04-15 DIAGNOSIS — E7800 Pure hypercholesterolemia, unspecified: Secondary | ICD-10-CM

## 2018-04-15 DIAGNOSIS — R7303 Prediabetes: Secondary | ICD-10-CM

## 2018-04-15 DIAGNOSIS — Z6830 Body mass index (BMI) 30.0-30.9, adult: Secondary | ICD-10-CM

## 2018-04-16 NOTE — Progress Notes (Signed)
Office: [phone]  /  Fax: [phone] TeleHealth Visit:  Crystal Sandoval has verbally consented to this TeleHealth visit today. The patient is located at home, the provider is located at the [facility] office. The participants in this visit include the listed provider and patient and any and all parties involved. The visit was conducted today via telephone. Keisha was unable to use realtime audiovisual technology today and the telehealth visit was conducted via telephone.  HPI:   Chief Complaint: OBESITY Crystal Sandoval is here to discuss her progress with her obesity treatment plan. She is on the Category 2 plan +100 calories and is following her eating plan approximately 50 % of the time. She states she is exercising 0 minutes 0 times per week. Kailene thinks that she has stayed the same. She has been stressed and she has dome some stress eating. Her appetite has been normal. We were unable to weigh the patient today for this TeleHealth visit. She feels as if she has maintained weight since her last visit. She has lost 6 lbs since starting treatment with [place].  Pre-Diabetes Crystal Sandoval has a diagnosis of prediabetes based on her elevated Hgb A1c and was informed this puts her at greater risk of developing diabetes. Her last A1c was at 5.7 and last insulin level was at 12.1 Crystal Sandoval is not taking medications currently and she continues to work on diet and exercise to decrease risk of diabetes. She denies nausea or hypoglycemia.  Elevated Cholesterol Crystal Sandoval has elevated cholesterol and she has been trying to improve her cholesterol levels with intensive lifestyle modification including a low saturated fat diet, exercise and weight loss. Her last total cholesterol was at 238 and last LDL was at 157. She denies myalgias.  ASSESSMENT AND PLAN:  Prediabetes  Elevated cholesterol  Class 1 obesity with serious comorbidity and body mass index (BMI) of 30.0 to 30.9 in adult, unspecified obesity type  PLAN:  Pre-Diabetes Crystal Sandoval will continue to work on weight loss, increasing activity, increasing lean protein and decreasing simple carbohydrates in her diet to help decrease the risk of diabetes. She was informed that eating too many simple carbohydrates or too many calories at one sitting increases the likelihood of GI side effects. Crystal Sandoval agreed to follow up with [place] as directed to monitor her progress.  Elevated Cholesterol Crystal Sandoval was informed of the American Heart Association Guidelines emphasizing intensive lifestyle modifications as the first line treatment for elevated cholesterol. We discussed many lifestyle modifications today in depth, and Crystal Sandoval will continue to work on decreasing saturated fats such as fatty red meat, butter and many fried foods. Crystal Sandoval will also increase MUFA's, PUFA's, vegetables and lean protein in her diet and she will continue to work on exercise and weight loss efforts.  Obesity Crystal Sandoval is currently in the action stage of change. As such, her goal is to continue with weight loss efforts She has agreed to follow the Category 2 plan +100 calories Crystal Sandoval will start out walking in the neighborhood for weight loss and overall health benefits. We discussed the following Behavioral Modification Strategies today: increase H2O intake, no skipping meals, keeping healthy foods in the home, increasing lean protein intake, decreasing simple carbohydrates, increasing vegetables, decrease eating out and work on meal planning and easy cooking plans Crystal Sandoval will weigh herself at home. She will increase water and carbonated flavored drinks without calories.  Crystal Sandoval has agreed to follow up with our clinic in 2 weeks. She was informed of the importance of frequent follow  up visits to maximize her success with intensive lifestyle modifications for her multiple health conditions.  ALLERGIES: Allergies  Allergen Reactions  . Ibuprofen     Elevated kidney function    MEDICATIONS: Current Outpatient  Medications on File Prior to Visit  Medication Sig Dispense Refill  . Biotin 1000 MCG tablet Take 1,000 mcg by mouth 3 (three) times daily.    Marland Kitchen. buPROPion (WELLBUTRIN XL) 300 MG 24 hr tablet TAKE 1 TABLET BY MOUTH ONCE DAILY 90 tablet 3  . cholecalciferol (VITAMIN D) 1000 UNITS tablet Take 1,000 Units by mouth daily.      Marland Kitchen. FETZIMA 80 MG CP24 Take 1 capsule by mouth daily. 90 capsule 3  . valACYclovir (VALTREX) 1000 MG tablet   11  . vitamin B-12 (CYANOCOBALAMIN) 500 MCG tablet Take 500 mcg by mouth daily.     No current facility-administered medications on file prior to visit.     PAST MEDICAL HISTORY: Past Medical History:  Diagnosis Date  . Allergy   . Asthma   . Constipation   . Depression   . Frequent headaches   . GERD (gastroesophageal reflux disease)   . High cholesterol   . Kidney problem   . Lactose intolerance   . Migraine   . Vitamin D deficiency     PAST SURGICAL HISTORY: Past Surgical History:  Procedure Laterality Date  . WISDOM TOOTH EXTRACTION      SOCIAL HISTORY: Social History   Tobacco Use  . Smoking status: Never Smoker  . Smokeless tobacco: Never Used  Substance Use Topics  . Alcohol use: Yes    Alcohol/week: 0.0 standard drinks    Comment: Occ  . Drug use: Not on file    FAMILY HISTORY: Family History  Problem Relation Age of Onset  . Alcohol abuse Mother   . Drug abuse Mother   . High Cholesterol Mother   . High blood pressure Father   . High Cholesterol Father   . Depression Father   . Breast cancer Maternal Grandmother   . Breast cancer Paternal Grandmother     ROS: Review of Systems  Constitutional: Negative for weight loss.  Gastrointestinal: Negative for nausea.  Musculoskeletal: Negative for myalgias.  Endo/Heme/Allergies:       Negative for hypoglycemia    PHYSICAL EXAM: Pt in no acute distress  RECENT LABS AND TESTS: BMET    Component Value Date/Time   NA 137 02/18/2018 0926   K 4.4 02/18/2018 0926   CL 99  02/18/2018 0926   CO2 18 (L) 02/18/2018 0926   GLUCOSE 85 02/18/2018 0926   GLUCOSE 96 03/25/2015 1545   BUN 13 02/18/2018 0926   CREATININE 1.09 (H) 02/18/2018 0926   CREATININE 0.88 03/25/2015 1545   CALCIUM 9.3 02/18/2018 0926   GFRNONAA 63 02/18/2018 0926   GFRAA 72 02/18/2018 0926   Lab Results  Component Value Date   HGBA1C 5.7 (H) 02/18/2018   Lab Results  Component Value Date   INSULIN 12.1 02/18/2018   CBC    Component Value Date/Time   WBC 3.5 02/18/2018 0926   WBC 5.1 07/10/2017 1308   RBC 4.43 02/18/2018 0926   RBC 4.14 07/10/2017 1308   HGB 13.4 02/18/2018 0926   HCT 38.2 02/18/2018 0926   PLT 310 02/18/2018 0926   MCV 86 02/18/2018 0926   MCH 30.2 02/18/2018 0926   MCH 30.9 03/25/2015 1545   MCHC 35.1 02/18/2018 0926   MCHC 34.6 07/10/2017 1308   RDW 12.2 02/18/2018  0926   LYMPHSABS 1.6 02/18/2018 0926   MONOABS 0.6 07/10/2017 1308   EOSABS 0.1 02/18/2018 0926   BASOSABS 0.0 02/18/2018 0926   Iron/TIBC/Ferritin/ %Sat No results found for: IRON, TIBC, FERRITIN, IRONPCTSAT Lipid Panel     Component Value Date/Time   CHOL 238 (H) 02/18/2018 0926   TRIG 68 02/18/2018 0926   HDL 67 02/18/2018 0926   CHOLHDL 3.6 02/18/2018 0926   CHOLHDL 3.2 03/25/2015 1545   VLDL 16 03/25/2015 1545   LDLCALC 157 (H) 02/18/2018 0926   Hepatic Function Panel     Component Value Date/Time   PROT 7.3 02/18/2018 0926   ALBUMIN 4.5 02/18/2018 0926   AST 20 02/18/2018 0926   ALT 13 02/18/2018 0926   ALKPHOS 104 02/18/2018 0926   BILITOT 0.4 02/18/2018 0926      Component Value Date/Time   TSH 1.120 02/18/2018 0926   TSH 1.19 03/25/2015 1545     Ref. Range 02/18/2018 09:26  Vitamin D, 25-Hydroxy Latest Ref Range: 30.0 - 100.0 ng/mL 47.7    I, [place] Crane, am acting as Energy manager for [employer]. Manson Passey, DO  I have reviewed the above documentation for accuracy and completeness, and I agree with the above. -Corinna Capra, DO

## 2018-04-28 ENCOUNTER — Telehealth: Payer: No Typology Code available for payment source | Admitting: Physician Assistant

## 2018-04-28 ENCOUNTER — Encounter: Payer: Self-pay | Admitting: Physician Assistant

## 2018-04-28 DIAGNOSIS — N390 Urinary tract infection, site not specified: Secondary | ICD-10-CM

## 2018-04-28 MED ORDER — NITROFURANTOIN MONOHYD MACRO 100 MG PO CAPS: 100.0000 mg | ORAL_CAPSULE | Freq: Two times a day (BID) | ORAL | 0 refills | Status: DC

## 2018-04-28 MED FILL — buPROPion HCL ER (XL) 300 M: 300 | 30 days supply | Qty: 30 | Fill #0

## 2018-04-28 MED FILL — NITROFURANTOIN MONO-MCR 100: 100 | 5 days supply | Qty: 10 | Fill #0

## 2018-04-28 NOTE — Progress Notes (Signed)
We are sorry that you are not feeling well.  Here is how we plan to help!  Based on what you shared with me it looks like you most likely have a simple urinary tract infection.  A UTI (Urinary Tract Infection) is a bacterial infection of the bladder.  Most cases of urinary tract infections are simple to treat but a key part of your care is to encourage you to drink plenty of fluids and watch your symptoms carefully.  I have prescribed MacroBid 100 mg twice a day for 5 days.  Your symptoms should gradually improve. Call us if the burning in your urine worsens, you develop worsening fever, back pain or pelvic pain or if your symptoms do not resolve after completing the antibiotic.  Urinary tract infections can be prevented by drinking plenty of water to keep your body hydrated.  Also be sure when you wipe, wipe from front to back and don't hold it in!  If possible, empty your bladder every 4 hours.  Your e-visit answers were reviewed by a board certified advanced clinical practitioner to complete your personal care plan.  Depending on the condition, your plan could have included both over the counter or prescription medications.  If there is a problem please reply  once you have received a response from your provider.  Your safety is important to us.  If you have drug allergies check your prescription carefully.    You can use MyChart to ask questions about today's visit, request a non-urgent call back, or ask for a work or school excuse for 24 hours related to this e-Visit. If it has been greater than 24 hours you will need to follow up with your provider, or enter a new e-Visit to address those concerns.   You will get an e-mail in the next two days asking about your experience.  I hope that your e-visit has been valuable and will speed your recovery. Thank you for using e-visits.   I have spent 7 min in completion and review of this note- Sahar Osman PAC  

## 2018-04-29 ENCOUNTER — Encounter (INDEPENDENT_AMBULATORY_CARE_PROVIDER_SITE_OTHER): Payer: Self-pay | Admitting: Bariatrics

## 2018-04-29 ENCOUNTER — Other Ambulatory Visit: Payer: Self-pay

## 2018-04-29 ENCOUNTER — Ambulatory Visit (INDEPENDENT_AMBULATORY_CARE_PROVIDER_SITE_OTHER): Payer: No Typology Code available for payment source | Admitting: Bariatrics

## 2018-04-29 DIAGNOSIS — E669 Obesity, unspecified: Secondary | ICD-10-CM | POA: Diagnosis not present

## 2018-04-29 DIAGNOSIS — E7800 Pure hypercholesterolemia, unspecified: Secondary | ICD-10-CM | POA: Diagnosis not present

## 2018-04-29 DIAGNOSIS — Z6830 Body mass index (BMI) 30.0-30.9, adult: Secondary | ICD-10-CM

## 2018-04-29 DIAGNOSIS — R7303 Prediabetes: Secondary | ICD-10-CM | POA: Diagnosis not present

## 2018-04-29 NOTE — Progress Notes (Signed)
Office: [phone]  /  Fax: [phone] TeleHealth Visit:  Crystal Sandoval has verbally consented to this TeleHealth visit today. The patient is located at home, the provider is located at the [facility] office. The participants in this visit include the listed provider and patient home. Crystal Sandoval was unable to use realtime audiovisual technology today and the telehealth visit was conducted via telephone.  HPI:   Chief Complaint: OBESITY Crystal Sandoval is here to discuss her progress with her obesity treatment plan. She is on the Category 2 plan + 100 calories and is following her eating plan approximately 70 % of the time. She states she is walking 30 minutes 2 times per week. Crystal Sandoval states that her weight is the same as her last visit. She has "good and bad days". Crystal Sandoval is doing minimal stress eating.  We were unable to weigh the patient today for this TeleHealth visit. She feels as if she has maintained weight since her last visit. She has lost 6 lbs since starting treatment with us.  Pre-Diabetes Crystal Sandoval has a diagnosis of pre-diabetes based on her elevated Hgb A1c and was informed this puts her at greater risk of developing diabetes. Her last A1c was 5.7 and fasting Insulin was 12.1 on 02/18/18. She is not taking metformin currently and her hunger is controlled. She continues to work on diet and exercise to decrease risk of diabetes.   Elevated Cholesterol Crystal Sandoval has elevated cholesterol and is not taking medications.  ASSESSMENT AND PLAN:  Prediabetes  Elevated cholesterol  Class 1 obesity with serious comorbidity and body mass index (BMI) of 30.0 to 30.9 in adult, unspecified obesity type  PLAN:  Pre-Diabetes Crystal Sandoval will continue to work on weight loss, exercise, and decreasing simple carbohydrates in her diet to help decrease the risk of diabetes. She was informed that eating too many simple carbohydrates or too many calories at one sitting increases the likelihood of GI side effects.  Jodi agrees to increase her protein and exercise and to decrease her carbohydrates while continuing with weight loss efforts. Crystal Sandoval agreed to follow up with us as directed to monitor her progress in 2 weeks.   Elevated Cholesterol Crystal Sandoval agrees to decrease monosaturated fats and to increase polysaturated fats in her diet. She agrees to follow up at the agreed upon time.  Obesity Crystal Sandoval is currently in the action stage of change. As such, her goal is to continue with weight loss efforts. She has agreed to follow the Category 2 plan + 100 calories. She will increase water intake and meal planning. Crystal Sandoval will also weigh herself at home. Crystal Sandoval has been instructed to work up to a goal of 150 minutes of combined cardio and strengthening exercise per week for weight loss and overall health benefits. We discussed the following Behavioral Modification Strategies today: increasing lean protein intake, decreasing simple carbohydrates, increasing vegetables, increasing H2O intake, decrease eating out, no skipping meals, keeping healthy foods in the home, work on meal planning and easy cooking plans, dealing with family or coworker sabotage, travel eating strategies, holiday eating strategies, and celebration eating strategies.  Crystal Sandoval has agreed to follow up with our clinic in 2 weeks. She was informed of the importance of frequent follow up visits to maximize her success with intensive lifestyle modifications for her multiple health conditions.  ALLERGIES: Allergies  Allergen Reactions  . Ibuprofen     Elevated kidney function    MEDICATIONS: Current Outpatient Medications on File Prior to Visit  Medication Sig Dispense Refill  .  Biotin 1000 MCG tablet Take 1,000 mcg by mouth 3 (three) times daily.    Marland Kitchen buPROPion (WELLBUTRIN XL) 300 MG 24 hr tablet TAKE 1 TABLET BY MOUTH ONCE DAILY 90 tablet 3  . cholecalciferol (VITAMIN D) 1000 UNITS tablet Take 1,000 Units by mouth daily.      Marland Kitchen FETZIMA 80 MG CP24 Take 1  capsule by mouth daily. 90 capsule 3  . nitrofurantoin, macrocrystal-monohydrate, (MACROBID) 100 MG capsule Take 1 capsule (100 mg total) by mouth 2 (two) times daily. 10 capsule 0  . valACYclovir (VALTREX) 1000 MG tablet   11  . vitamin B-12 (CYANOCOBALAMIN) 500 MCG tablet Take 500 mcg by mouth daily.     No current facility-administered medications on file prior to visit.     PAST MEDICAL HISTORY: Past Medical History:  Diagnosis Date  . Allergy   . Asthma   . Constipation   . Depression   . Frequent headaches   . GERD (gastroesophageal reflux disease)   . High cholesterol   . Kidney problem   . Lactose intolerance   . Migraine   . Vitamin D deficiency     PAST SURGICAL HISTORY: Past Surgical History:  Procedure Laterality Date  . WISDOM TOOTH EXTRACTION      SOCIAL HISTORY: Social History   Tobacco Use  . Smoking status: Never Smoker  . Smokeless tobacco: Never Used  Substance Use Topics  . Alcohol use: Yes    Alcohol/week: 0.0 standard drinks    Comment: Occ  . Drug use: Not on file    FAMILY HISTORY: Family History  Problem Relation Age of Onset  . Alcohol abuse Mother   . Drug abuse Mother   . High Cholesterol Mother   . High blood pressure Father   . High Cholesterol Father   . Depression Father   . Breast cancer Maternal Grandmother   . Breast cancer Paternal Grandmother     ROS: ROS  PHYSICAL EXAM: Pt in no acute distress  RECENT LABS AND TESTS: BMET    Component Value Date/Time   NA 137 02/18/2018 0926   K 4.4 02/18/2018 0926   CL 99 02/18/2018 0926   CO2 18 (L) 02/18/2018 0926   GLUCOSE 85 02/18/2018 0926   GLUCOSE 96 03/25/2015 1545   BUN 13 02/18/2018 0926   CREATININE 1.09 (H) 02/18/2018 0926   CREATININE 0.88 03/25/2015 1545   CALCIUM 9.3 02/18/2018 0926   GFRNONAA 63 02/18/2018 0926   GFRAA 72 02/18/2018 0926   Lab Results  Component Value Date   HGBA1C 5.7 (H) 02/18/2018   Lab Results  Component Value Date   INSULIN  12.1 02/18/2018   CBC    Component Value Date/Time   WBC 3.5 02/18/2018 0926   WBC 5.1 07/10/2017 1308   RBC 4.43 02/18/2018 0926   RBC 4.14 07/10/2017 1308   HGB 13.4 02/18/2018 0926   HCT 38.2 02/18/2018 0926   PLT 310 02/18/2018 0926   MCV 86 02/18/2018 0926   MCH 30.2 02/18/2018 0926   MCH 30.9 03/25/2015 1545   MCHC 35.1 02/18/2018 0926   MCHC 34.6 07/10/2017 1308   RDW 12.2 02/18/2018 0926   LYMPHSABS 1.6 02/18/2018 0926   MONOABS 0.6 07/10/2017 1308   EOSABS 0.1 02/18/2018 0926   BASOSABS 0.0 02/18/2018 0926   Iron/TIBC/Ferritin/ %Sat No results found for: IRON, TIBC, FERRITIN, IRONPCTSAT Lipid Panel     Component Value Date/Time   CHOL 238 (H) 02/18/2018 0926   TRIG 68 02/18/2018  0926   HDL 67 02/18/2018 0926   CHOLHDL 3.6 02/18/2018 0926   CHOLHDL 3.2 03/25/2015 1545   VLDL 16 03/25/2015 1545   LDLCALC 157 (H) 02/18/2018 0926   Hepatic Function Panel     Component Value Date/Time   PROT 7.3 02/18/2018 0926   ALBUMIN 4.5 02/18/2018 0926   AST 20 02/18/2018 0926   ALT 13 02/18/2018 0926   ALKPHOS 104 02/18/2018 0926   BILITOT 0.4 02/18/2018 0926      Component Value Date/Time   TSH 1.120 02/18/2018 0926   TSH 1.19 03/25/2015 1545   Results for CLAUDETT, BAYLY (MRN [id number]) as of 04/29/2018 09:36  Ref. Range 02/18/2018 09:26  Vitamin D, 25-Hydroxy Latest Ref Range: 30.0 - 100.0 ng/mL 47.7     I, Kirke Corin, CMA, am acting as Energy manager for [employer]. Manson Passey, DO  I have reviewed the above documentation for accuracy and completeness, and I agree with the above. -Corinna Capra, DO

## 2018-05-13 ENCOUNTER — Other Ambulatory Visit: Payer: Self-pay

## 2018-05-13 ENCOUNTER — Encounter (INDEPENDENT_AMBULATORY_CARE_PROVIDER_SITE_OTHER): Payer: Self-pay | Admitting: Bariatrics

## 2018-05-13 ENCOUNTER — Ambulatory Visit (INDEPENDENT_AMBULATORY_CARE_PROVIDER_SITE_OTHER): Payer: No Typology Code available for payment source | Admitting: Bariatrics

## 2018-05-13 DIAGNOSIS — Z6830 Body mass index (BMI) 30.0-30.9, adult: Secondary | ICD-10-CM

## 2018-05-13 DIAGNOSIS — E669 Obesity, unspecified: Secondary | ICD-10-CM | POA: Diagnosis not present

## 2018-05-13 DIAGNOSIS — R7303 Prediabetes: Secondary | ICD-10-CM | POA: Diagnosis not present

## 2018-05-13 DIAGNOSIS — E7800 Pure hypercholesterolemia, unspecified: Secondary | ICD-10-CM | POA: Diagnosis not present

## 2018-05-14 NOTE — Progress Notes (Signed)
Office: [phone]  /  Fax: [phone] TeleHealth Visit:  Rona Ravensiki Rendleman has verbally consented to this TeleHealth visit today. The patient is located at home, the provider is located at the [facility] office. The participants in this visit include the listed provider and patient and any and all parties involved. The visit was conducted today via telephone. Amelia was unable to use realtime audiovisual technology today (unable to FaceTime) and the telehealth visit was conducted via telephone.  HPI:   Chief Complaint: OBESITY Vaani is here to discuss her progress with her obesity treatment plan. She is on the Category 2 plan and is following her eating plan approximately 35 % of the time. She states she is exercising 30 to 45 minutes 3 to 4 times per week. Mykah states that she has gained a couple of pounds since the last visit (weight 172 lbs today). She has struggled with meal planning. She has had some stress eating. We were unable to weigh the patient today for this TeleHealth visit. She feels as if she has gained weight since her last visit. She has lost 2 lbs since starting treatment with us.  Pre-Diabetes Avalynn has a diagnosis of prediabetes based on her elevated Hgb A1c and was informed this puts her at greater risk of developing diabetes. Her last A1c was at 5.7 and last insulin level was at 12.1 She is not taking medications currently and continues to work on diet and exercise to decrease risk of diabetes. She has normal hunger and she denies nausea or hypoglycemia.  Elevated Cholesterol Jaylenne has elevated cholesterol. Last labs were 02/18/18 and cholesterol was elevated. Hafsah is not on medications. She is attempting to improve her cholesterol levels with intensive lifestyle modification including a low saturated fat diet, exercise and weight loss. She denies myalgias.  ASSESSMENT AND PLAN:  Prediabetes  Elevated cholesterol  Class 1 obesity with serious comorbidity and  body mass index (BMI) of 30.0 to 30.9 in adult, unspecified obesity type  PLAN:  Pre-Diabetes Sondi will continue to work on weight loss, exercise, increasing lean protein and decreasing simple carbohydrates in her diet to help decrease the risk of diabetes. She was informed that eating too many simple carbohydrates or too many calories at one sitting increases the likelihood of GI side effects. Camrynn agreed to follow up with us as directed to monitor her progress.  Elevated Cholesterol Koryn was informed of the American Heart Association Guidelines emphasizing intensive lifestyle modifications as the first line treatment for elevated cholesterol. We discussed many lifestyle modifications today in depth, and Jayana will continue to work on decreasing saturated fats such as fatty red meat, butter and many fried foods. She will also increase MUFA's, PUFA's, vegetables and lean protein in her diet and continue to work on exercise and weight loss efforts.  Obesity Nakyra is currently in the action stage of change. As such, her goal is to continue with weight loss efforts She has agreed to follow the Category 2 plan Lynsey has been instructed to work up to a goal of 150 minutes of combined cardio and strengthening exercise per week for weight loss and overall health benefits. We discussed the following Behavioral Modification Strategies today: increase H2O intake, no skipping meals, keeping healthy foods in the home, increasing lean protein intake, decreasing simple carbohydrates, increasing vegetables, decrease eating out and work on meal planning and intentional eating Sritha will weigh herself at home and record before each visit. She will meal plan and prep (  weekends).  Allani has agreed to follow up with our clinic in 2 weeks. She was informed of the importance of frequent follow up visits to maximize her success with intensive lifestyle modifications for her multiple health conditions.  ALLERGIES: Allergies   Allergen Reactions  . Ibuprofen     Elevated kidney function    MEDICATIONS: Current Outpatient Medications on File Prior to Visit  Medication Sig Dispense Refill  . Biotin 1000 MCG tablet Take 1,000 mcg by mouth 3 (three) times daily.    Marland Kitchen buPROPion (WELLBUTRIN XL) 300 MG 24 hr tablet TAKE 1 TABLET BY MOUTH ONCE DAILY 90 tablet 3  . cholecalciferol (VITAMIN D) 1000 UNITS tablet Take 1,000 Units by mouth daily.      Marland Kitchen FETZIMA 80 MG CP24 Take 1 capsule by mouth daily. 90 capsule 3  . nitrofurantoin, macrocrystal-monohydrate, (MACROBID) 100 MG capsule Take 1 capsule (100 mg total) by mouth 2 (two) times daily. 10 capsule 0  . valACYclovir (VALTREX) 1000 MG tablet   11  . vitamin B-12 (CYANOCOBALAMIN) 500 MCG tablet Take 500 mcg by mouth daily.     No current facility-administered medications on file prior to visit.     PAST MEDICAL HISTORY: Past Medical History:  Diagnosis Date  . Allergy   . Asthma   . Constipation   . Depression   . Frequent headaches   . GERD (gastroesophageal reflux disease)   . High cholesterol   . Kidney problem   . Lactose intolerance   . Migraine   . Vitamin D deficiency     PAST SURGICAL HISTORY: Past Surgical History:  Procedure Laterality Date  . WISDOM TOOTH EXTRACTION      SOCIAL HISTORY: Social History   Tobacco Use  . Smoking status: Never Smoker  . Smokeless tobacco: Never Used  Substance Use Topics  . Alcohol use: Yes    Alcohol/week: 0.0 standard drinks    Comment: Occ  . Drug use: Not on file    FAMILY HISTORY: Family History  Problem Relation Age of Onset  . Alcohol abuse Mother   . Drug abuse Mother   . High Cholesterol Mother   . High blood pressure Father   . High Cholesterol Father   . Depression Father   . Breast cancer Maternal Grandmother   . Breast cancer Paternal Grandmother     ROS: Review of Systems  Constitutional: Negative for weight loss.  Gastrointestinal: Negative for nausea.  Musculoskeletal:  Negative for myalgias.  Endo/Heme/Allergies:       Negative for polyphagia Negative for hypoglycemia    PHYSICAL EXAM: Pt in no acute distress  RECENT LABS AND TESTS: BMET    Component Value Date/Time   NA 137 02/18/2018 0926   K 4.4 02/18/2018 0926   CL 99 02/18/2018 0926   CO2 18 (L) 02/18/2018 0926   GLUCOSE 85 02/18/2018 0926   GLUCOSE 96 03/25/2015 1545   BUN 13 02/18/2018 0926   CREATININE 1.09 (H) 02/18/2018 0926   CREATININE 0.88 03/25/2015 1545   CALCIUM 9.3 02/18/2018 0926   GFRNONAA 63 02/18/2018 0926   GFRAA 72 02/18/2018 0926   Lab Results  Component Value Date   HGBA1C 5.7 (H) 02/18/2018   Lab Results  Component Value Date   INSULIN 12.1 02/18/2018   CBC    Component Value Date/Time   WBC 3.5 02/18/2018 0926   WBC 5.1 07/10/2017 1308   RBC 4.43 02/18/2018 0926   RBC 4.14 07/10/2017 1308   HGB 13.4  02/18/2018 0926   HCT 38.2 02/18/2018 0926   PLT 310 02/18/2018 0926   MCV 86 02/18/2018 0926   MCH 30.2 02/18/2018 0926   MCH 30.9 03/25/2015 1545   MCHC 35.1 02/18/2018 0926   MCHC 34.6 07/10/2017 1308   RDW 12.2 02/18/2018 0926   LYMPHSABS 1.6 02/18/2018 0926   MONOABS 0.6 07/10/2017 1308   EOSABS 0.1 02/18/2018 0926   BASOSABS 0.0 02/18/2018 0926   Iron/TIBC/Ferritin/ %Sat No results found for: IRON, TIBC, FERRITIN, IRONPCTSAT Lipid Panel     Component Value Date/Time   CHOL 238 (H) 02/18/2018 0926   TRIG 68 02/18/2018 0926   HDL 67 02/18/2018 0926   CHOLHDL 3.6 02/18/2018 0926   CHOLHDL 3.2 03/25/2015 1545   VLDL 16 03/25/2015 1545   LDLCALC 157 (H) 02/18/2018 0926   Hepatic Function Panel     Component Value Date/Time   PROT 7.3 02/18/2018 0926   ALBUMIN 4.5 02/18/2018 0926   AST 20 02/18/2018 0926   ALT 13 02/18/2018 0926   ALKPHOS 104 02/18/2018 0926   BILITOT 0.4 02/18/2018 0926      Component Value Date/Time   TSH 1.120 02/18/2018 0926   TSH 1.19 03/25/2015 1545     Ref. Range 02/18/2018 09:26  Vitamin D, 25-Hydroxy  Latest Ref Range: 30.0 - 100.0 ng/mL 47.7    I, [place] Crane, am acting as Energy manager for [employer]. Manson Passey, DO  I have reviewed the above documentation for accuracy and completeness, and I agree with the above. -Corinna Capra, DO

## 2018-05-27 ENCOUNTER — Ambulatory Visit (INDEPENDENT_AMBULATORY_CARE_PROVIDER_SITE_OTHER): Payer: Self-pay | Admitting: Bariatrics

## 2018-05-28 ENCOUNTER — Ambulatory Visit (INDEPENDENT_AMBULATORY_CARE_PROVIDER_SITE_OTHER): Payer: No Typology Code available for payment source | Admitting: Bariatrics

## 2018-05-28 ENCOUNTER — Other Ambulatory Visit: Payer: Self-pay

## 2018-05-28 DIAGNOSIS — E7800 Pure hypercholesterolemia, unspecified: Secondary | ICD-10-CM | POA: Diagnosis not present

## 2018-05-28 DIAGNOSIS — E669 Obesity, unspecified: Secondary | ICD-10-CM | POA: Diagnosis not present

## 2018-05-28 DIAGNOSIS — Z6830 Body mass index (BMI) 30.0-30.9, adult: Secondary | ICD-10-CM

## 2018-05-28 DIAGNOSIS — R7303 Prediabetes: Secondary | ICD-10-CM

## 2018-05-29 ENCOUNTER — Encounter (INDEPENDENT_AMBULATORY_CARE_PROVIDER_SITE_OTHER): Payer: Self-pay | Admitting: Bariatrics

## 2018-05-29 DIAGNOSIS — I10 Essential (primary) hypertension: Secondary | ICD-10-CM | POA: Insufficient documentation

## 2018-05-29 NOTE — Progress Notes (Signed)
Office: [phone]  /  Fax: [phone] TeleHealth Visit:  Rona Ravensiki Crystal Sandoval has verbally consented to this TeleHealth visit today. The patient is located at home, the provider is located at the [facility] office. The participants in this visit include the listed provider and patient and any and all parties involved. The visit was conducted today via telephone. [name] was unable to use realtime audiovisual technology today and the telehealth visit was conducted via telephone.  HPI:   Chief Complaint: OBESITY Crystal Sandoval is here to discuss her progress with her obesity treatment plan. She is on the Category 2 plan and is following her eating plan approximately 80 % of the time. She states she is walking 30 to 45 minutes 3 to 4 times per week. Crystal Sandoval states that she has lost 2 pounds (weight 169 lbs). She states that she is getting more water. We were unable to weigh the patient today for this TeleHealth visit. She feels as if she has lost weight since her last visit. She has lost 5 lbs since starting treatment with us.  Pre-Diabetes Crystal Sandoval has a diagnosis of prediabetes based on her elevated Hgb A1c and was informed this puts her at greater risk of developing diabetes. She is not taking medications currently and continues to work on diet and exercise to decrease risk of diabetes. She denies hypoglycemia.  Elevated Cholesterol Crystal Sandoval has elevated cholesterol and she is not on medications. She has been trying to improve her cholesterol levels with intensive lifestyle modification including a low saturated fat diet, exercise and weight loss. She denies myalgias.  ASSESSMENT AND PLAN:  Prediabetes  Elevated cholesterol  Essential hypertension  Class 1 obesity with serious comorbidity and body mass index (BMI) of 30.0 to 30.9 in adult, unspecified obesity type  PLAN:  Pre-Diabetes [name] will continue to work on weight loss, exercise, increasing lean protein and decreasing simple  carbohydrates in her diet to help decrease the risk of diabetes. She was informed that eating too many simple carbohydrates or too many calories at one sitting increases the likelihood of GI side effects. [name] agreed to follow up with us as directed to monitor her progress.  Elevated Cholesterol Erik was informed of the American Heart Association Guidelines emphasizing intensive lifestyle modifications as the first line treatment for elevated cholesterol. We discussed many lifestyle modifications today in depth, and [name] will continue to work on decreasing saturated fats such as fatty red meat, butter and many fried foods. She will also increase PUFA's, MUFA's, vegetables and lean protein in her diet and continue to work on exercise and weight loss efforts.  Obesity Crystal Sandoval is currently in the action stage of change. As such, her goal is to continue with weight loss efforts She has agreed to follow the Category 2 plan Crystal Sandoval will continue activity for weight loss and overall health benefits. We discussed the following Behavioral Modification Strategies today: planning for success, increase H2O intake, no skipping meals, keeping healthy foods in the home, increasing lean protein intake, decreasing simple carbohydrates, increasing vegetables, decrease eating out and work on meal planning and easy cooking plans Crystal Sandoval will weigh herself at home and record before each visit. She will take her meals to work. Meat substitutions were discussed today.  Crystal Sandoval has agreed to follow up with our clinic in 2 weeks. She was informed of the importance of frequent follow up visits to maximize her success with intensive lifestyle modifications for her multiple health conditions.  ALLERGIES: Allergies  Allergen Reactions  .  Ibuprofen     Elevated kidney function    MEDICATIONS: Current Outpatient Medications on File Prior to Visit  Medication Sig Dispense Refill  . Biotin 1000 MCG tablet Take 1,000 mcg by mouth 3  (three) times daily.    Marland Kitchen buPROPion (WELLBUTRIN XL) 300 MG 24 hr tablet TAKE 1 TABLET BY MOUTH ONCE DAILY 90 tablet 3  . cholecalciferol (VITAMIN D) 1000 UNITS tablet Take 1,000 Units by mouth daily.      Marland Kitchen FETZIMA 80 MG CP24 Take 1 capsule by mouth daily. 90 capsule 3  . nitrofurantoin, macrocrystal-monohydrate, (MACROBID) 100 MG capsule Take 1 capsule (100 mg total) by mouth 2 (two) times daily. 10 capsule 0  . valACYclovir (VALTREX) 1000 MG tablet   11  . vitamin B-12 (CYANOCOBALAMIN) 500 MCG tablet Take 500 mcg by mouth daily.     No current facility-administered medications on file prior to visit.     PAST MEDICAL HISTORY: Past Medical History:  Diagnosis Date  . Allergy   . Asthma   . Constipation   . Depression   . Frequent headaches   . GERD (gastroesophageal reflux disease)   . High cholesterol   . Kidney problem   . Lactose intolerance   . Migraine   . Vitamin D deficiency     PAST SURGICAL HISTORY: Past Surgical History:  Procedure Laterality Date  . WISDOM TOOTH EXTRACTION      SOCIAL HISTORY: Social History   Tobacco Use  . Smoking status: Never Smoker  . Smokeless tobacco: Never Used  Substance Use Topics  . Alcohol use: Yes    Alcohol/week: 0.0 standard drinks    Comment: Occ  . Drug use: Not on file    FAMILY HISTORY: Family History  Problem Relation Age of Onset  . Alcohol abuse Mother   . Drug abuse Mother   . High Cholesterol Mother   . High blood pressure Father   . High Cholesterol Father   . Depression Father   . Breast cancer Maternal Grandmother   . Breast cancer Paternal Grandmother     ROS: Review of Systems  Constitutional: Positive for weight loss.  Respiratory: Shortness of breath: on exertion.   Musculoskeletal: Negative for myalgias.  Endo/Heme/Allergies:       Negative for hypoglycemia    PHYSICAL EXAM: Pt in no acute distress  RECENT LABS AND TESTS: BMET    Component Value Date/Time   NA 137 02/18/2018 0926    K 4.4 02/18/2018 0926   CL 99 02/18/2018 0926   CO2 18 (L) 02/18/2018 0926   GLUCOSE 85 02/18/2018 0926   GLUCOSE 96 03/25/2015 1545   BUN 13 02/18/2018 0926   CREATININE 1.09 (H) 02/18/2018 0926   CREATININE 0.88 03/25/2015 1545   CALCIUM 9.3 02/18/2018 0926   GFRNONAA 63 02/18/2018 0926   GFRAA 72 02/18/2018 0926   Lab Results  Component Value Date   HGBA1C 5.7 (H) 02/18/2018   Lab Results  Component Value Date   INSULIN 12.1 02/18/2018   CBC    Component Value Date/Time   WBC 3.5 02/18/2018 0926   WBC 5.1 07/10/2017 1308   RBC 4.43 02/18/2018 0926   RBC 4.14 07/10/2017 1308   HGB 13.4 02/18/2018 0926   HCT 38.2 02/18/2018 0926   PLT 310 02/18/2018 0926   MCV 86 02/18/2018 0926   MCH 30.2 02/18/2018 0926   MCH 30.9 03/25/2015 1545   MCHC 35.1 02/18/2018 0926   MCHC 34.6 07/10/2017 1308  RDW 12.2 02/18/2018 0926   LYMPHSABS 1.6 02/18/2018 0926   MONOABS 0.6 07/10/2017 1308   EOSABS 0.1 02/18/2018 0926   BASOSABS 0.0 02/18/2018 0926   Iron/TIBC/Ferritin/ %Sat No results found for: IRON, TIBC, FERRITIN, IRONPCTSAT Lipid Panel     Component Value Date/Time   CHOL 238 (H) 02/18/2018 0926   TRIG 68 02/18/2018 0926   HDL 67 02/18/2018 0926   CHOLHDL 3.6 02/18/2018 0926   CHOLHDL 3.2 03/25/2015 1545   VLDL 16 03/25/2015 1545   LDLCALC 157 (H) 02/18/2018 0926   Hepatic Function Panel     Component Value Date/Time   PROT 7.3 02/18/2018 0926   ALBUMIN 4.5 02/18/2018 0926   AST 20 02/18/2018 0926   ALT 13 02/18/2018 0926   ALKPHOS 104 02/18/2018 0926   BILITOT 0.4 02/18/2018 0926      Component Value Date/Time   TSH 1.120 02/18/2018 0926   TSH 1.19 03/25/2015 1545     Ref. Range 02/18/2018 09:26  Vitamin D, 25-Hydroxy Latest Ref Range: 30.0 - 100.0 ng/mL 47.7    I, [place] Crane, am acting as Energy manager for [employer]. Manson Passey, DO  I have reviewed the above documentation for accuracy and completeness, and I agree with the above. -Corinna Capra, DO

## 2018-06-03 MED FILL — FETZIMA ER 80 MG CAPSULE: 80 | 30 days supply | Qty: 30 | Fill #3

## 2018-06-10 ENCOUNTER — Ambulatory Visit (INDEPENDENT_AMBULATORY_CARE_PROVIDER_SITE_OTHER): Payer: No Typology Code available for payment source | Admitting: Bariatrics

## 2018-06-10 ENCOUNTER — Other Ambulatory Visit: Payer: Self-pay

## 2018-06-10 DIAGNOSIS — Z6830 Body mass index (BMI) 30.0-30.9, adult: Secondary | ICD-10-CM | POA: Diagnosis not present

## 2018-06-10 DIAGNOSIS — I10 Essential (primary) hypertension: Secondary | ICD-10-CM | POA: Diagnosis not present

## 2018-06-10 DIAGNOSIS — E559 Vitamin D deficiency, unspecified: Secondary | ICD-10-CM

## 2018-06-10 DIAGNOSIS — E669 Obesity, unspecified: Secondary | ICD-10-CM | POA: Diagnosis not present

## 2018-06-11 ENCOUNTER — Encounter (INDEPENDENT_AMBULATORY_CARE_PROVIDER_SITE_OTHER): Payer: Self-pay | Admitting: Bariatrics

## 2018-06-11 ENCOUNTER — Ambulatory Visit (INDEPENDENT_AMBULATORY_CARE_PROVIDER_SITE_OTHER): Payer: No Typology Code available for payment source | Admitting: Bariatrics

## 2018-06-11 NOTE — Progress Notes (Signed)
Office: [phone]  /  Fax: [phone] TeleHealth Visit:  Crystal Sandoval has verbally consented to this TeleHealth visit today. The patient is located at home, the provider is located at the [facility] office. The participants in this visit include the listed provider and patient and any and all parties involved. The visit was conducted today via telephone. Crystal Sandoval was unable to use realtime audiovisual technology today and the telehealth visit was conducted via telephone 15 minutes .  HPI:   Chief Complaint: OBESITY Crystal Sandoval is here to discuss her progress with her obesity treatment plan. She is on the Category 2 plan and is following her eating plan approximately 70 to 80 % of the time. She states she is exercising 0 minutes 0 times per week. Tewana states that she is up 1 pound since her last visit on 02/18/18 (weight 170 lbs). We were unable to weigh the patient today for this TeleHealth visit. She feels as if she has gained weight since her last visit. She has lost 4 lbs since starting treatment with [place].  Hypertension Crystal Sandoval is a 43 y.o. female with hypertension. She is not on medications. Crystal Sandoval denies headache. She is working weight loss to help control her blood pressure with the goal of decreasing her risk of heart attack and stroke. Crystal Sandoval blood pressure is currently controlled.  Vitamin D deficiency Crystal Sandoval has a diagnosis of vitamin D deficiency. Her last vitamin D level was at 47.7. Crystal Sandoval is currently taking OTC vit D and she denies nausea, vomiting or muscle weakness.  ASSESSMENT AND PLAN:  Essential hypertension  Vitamin D deficiency  Class 1 obesity with serious comorbidity and body mass index (BMI) of 30.0 to 30.9 in adult, unspecified obesity type  PLAN:  Hypertension We discussed sodium restriction (no added salt, and rinse canned vegetables), working on healthy weight loss, and a regular exercise program as the means to achieve improved blood pressure  control. Crystal Sandoval agreed with this plan and agreed to follow up as directed. We will continue to monitor her blood pressure as well as her progress with the above lifestyle modifications. She will continue her medications as prescribed and will watch for signs of hypotension as she continues her lifestyle modifications.  Vitamin D Deficiency Crystal Sandoval was informed that low vitamin D levels contributes to fatigue and are associated with obesity, breast, and colon cancer. She will continue OTC Vitamin D and will follow up for routine testing of vitamin D, at least 2-3 times per year. She was informed of the risk of over-replacement of vitamin D and agrees to not increase her dose unless she discusses this with [place] first.  Obesity Crystal Sandoval is currently in the action stage of change. As such, her goal is to continue with weight loss efforts She has agreed to follow the Category 2 plan Crystal Sandoval will resume walking and light weights for weight loss and overall health benefits. We discussed the following Behavioral Modification Strategies today: increase H2O intake, no skipping meals, keeping healthy foods in the home, increasing lean protein intake, decreasing simple carbohydrates, increasing vegetables, decrease eating out and work on meal planning and easy cooking plans Crystal Sandoval will weigh herself at home and record. Options for breakfast were given to patient today.  Crystal Sandoval has agreed to follow up with our clinic when she has work. She was informed of the importance of frequent follow up visits to maximize her success with intensive lifestyle modifications for her multiple health conditions.  ALLERGIES: Allergies  Allergen Reactions   Ibuprofen     Elevated kidney function    MEDICATIONS: Current Outpatient Medications on File Prior to Visit  Medication Sig Dispense Refill   Biotin 1000 MCG tablet Take 1,000 mcg by mouth 3 (three) times daily.     buPROPion (WELLBUTRIN XL) 300 MG 24 hr tablet TAKE 1 TABLET BY  MOUTH ONCE DAILY 90 tablet 3   cholecalciferol (VITAMIN D) 1000 UNITS tablet Take 1,000 Units by mouth daily.       FETZIMA 80 MG CP24 Take 1 capsule by mouth daily. 90 capsule 3   nitrofurantoin, macrocrystal-monohydrate, (MACROBID) 100 MG capsule Take 1 capsule (100 mg total) by mouth 2 (two) times daily. 10 capsule 0   valACYclovir (VALTREX) 1000 MG tablet   11   vitamin B-12 (CYANOCOBALAMIN) 500 MCG tablet Take 500 mcg by mouth daily.     No current facility-administered medications on file prior to visit.     PAST MEDICAL HISTORY: Past Medical History:  Diagnosis Date   Allergy    Asthma    Constipation    Depression    Frequent headaches    GERD (gastroesophageal reflux disease)    High cholesterol    Kidney problem    Lactose intolerance    Migraine    Vitamin D deficiency     PAST SURGICAL HISTORY: Past Surgical History:  Procedure Laterality Date   WISDOM TOOTH EXTRACTION      SOCIAL HISTORY: Social History   Tobacco Use   Smoking status: Never Smoker   Smokeless tobacco: Never Used  Substance Use Topics   Alcohol use: Yes    Alcohol/week: 0.0 standard drinks    Comment: Occ   Drug use: Not on file    FAMILY HISTORY: Family History  Problem Relation Age of Onset   Alcohol abuse Mother    Drug abuse Mother    High Cholesterol Mother    High blood pressure Father    High Cholesterol Father    Depression Father    Breast cancer Maternal Grandmother    Breast cancer Paternal Grandmother     ROS: Review of Systems  Constitutional: Negative for weight loss.  Gastrointestinal: Negative for nausea and vomiting.  Musculoskeletal:       Negative for muscle weakness  Neurological: Negative for headaches.    PHYSICAL EXAM: Pt in no acute distress  RECENT LABS AND TESTS: BMET    Component Value Date/Time   NA 137 02/18/2018 0926   K 4.4 02/18/2018 0926   CL 99 02/18/2018 0926   CO2 18 (L) 02/18/2018 0926   GLUCOSE  85 02/18/2018 0926   GLUCOSE 96 03/25/2015 1545   BUN 13 02/18/2018 0926   CREATININE 1.09 (H) 02/18/2018 0926   CREATININE 0.88 03/25/2015 1545   CALCIUM 9.3 02/18/2018 0926   GFRNONAA 63 02/18/2018 0926   GFRAA 72 02/18/2018 0926   Lab Results  Component Value Date   HGBA1C 5.7 (H) 02/18/2018   Lab Results  Component Value Date   INSULIN 12.1 02/18/2018   CBC    Component Value Date/Time   WBC 3.5 02/18/2018 0926   WBC 5.1 07/10/2017 1308   RBC 4.43 02/18/2018 0926   RBC 4.14 07/10/2017 1308   HGB 13.4 02/18/2018 0926   HCT 38.2 02/18/2018 0926   PLT 310 02/18/2018 0926   MCV 86 02/18/2018 0926   MCH 30.2 02/18/2018 0926   MCH 30.9 03/25/2015 1545   MCHC 35.1 02/18/2018 0926   MCHC 34.6  07/10/2017 1308   RDW 12.2 02/18/2018 0926   LYMPHSABS 1.6 02/18/2018 0926   MONOABS 0.6 07/10/2017 1308   EOSABS 0.1 02/18/2018 0926   BASOSABS 0.0 02/18/2018 0926   Iron/TIBC/Ferritin/ %Sat No results found for: IRON, TIBC, FERRITIN, IRONPCTSAT Lipid Panel     Component Value Date/Time   CHOL 238 (H) 02/18/2018 0926   TRIG 68 02/18/2018 0926   HDL 67 02/18/2018 0926   CHOLHDL 3.6 02/18/2018 0926   CHOLHDL 3.2 03/25/2015 1545   VLDL 16 03/25/2015 1545   LDLCALC 157 (H) 02/18/2018 0926   Hepatic Function Panel     Component Value Date/Time   PROT 7.3 02/18/2018 0926   ALBUMIN 4.5 02/18/2018 0926   AST 20 02/18/2018 0926   ALT 13 02/18/2018 0926   ALKPHOS 104 02/18/2018 0926   BILITOT 0.4 02/18/2018 0926      Component Value Date/Time   TSH 1.120 02/18/2018 0926   TSH 1.19 03/25/2015 1545     Ref. Range 02/18/2018 09:26  Vitamin D, 25-Hydroxy Latest Ref Range: 30.0 - 100.0 ng/mL 47.7    I, [place] CraneJoanne Murray, am acting as Energy managertranscriptionist for [employer]. Manson PasseyBrown, DO  I have reviewed the above documentation for accuracy and completeness, and I agree with the above. -Corinna CapraAngel Emberlin Verner, DO

## 2018-07-03 MED FILL — buPROPion HCL ER (XL) 300 M: 300 | 30 days supply | Qty: 30 | Fill #1

## 2018-07-03 MED FILL — FETZIMA ER 80 MG CAPSULE: 80 | 30 days supply | Qty: 30 | Fill #4

## 2018-07-19 ENCOUNTER — Telehealth: Payer: No Typology Code available for payment source | Admitting: Nurse Practitioner

## 2018-07-19 DIAGNOSIS — N3000 Acute cystitis without hematuria: Secondary | ICD-10-CM | POA: Diagnosis not present

## 2018-07-19 MED ORDER — NITROFURANTOIN MONOHYD MACRO 100 MG PO CAPS: 100.0000 mg | ORAL_CAPSULE | Freq: Two times a day (BID) | ORAL | 0 refills | Status: DC

## 2018-07-19 NOTE — Progress Notes (Signed)

## 2018-08-27 ENCOUNTER — Other Ambulatory Visit: Payer: Self-pay | Admitting: Family Medicine

## 2018-08-27 DIAGNOSIS — F320 Major depressive disorder, single episode, mild: Secondary | ICD-10-CM

## 2018-08-27 MED FILL — FETZIMA ER 80 MG CAPSULE: 80 | 30 days supply | Qty: 30 | Fill #5

## 2018-08-29 MED FILL — buPROPion HCL ER (XL) 300 M: 300 | 90 days supply | Qty: 90 | Fill #0

## 2018-10-15 MED FILL — FETZIMA ER 80 MG CAPSULE: 80 | 30 days supply | Qty: 30 | Fill #6

## 2018-11-24 ENCOUNTER — Encounter: Payer: Self-pay | Admitting: Family Medicine

## 2018-11-24 ENCOUNTER — Other Ambulatory Visit: Payer: Self-pay

## 2018-11-24 ENCOUNTER — Other Ambulatory Visit (HOSPITAL_BASED_OUTPATIENT_CLINIC_OR_DEPARTMENT_OTHER): Payer: Self-pay | Admitting: Family Medicine

## 2018-11-24 ENCOUNTER — Other Ambulatory Visit (HOSPITAL_COMMUNITY)
Admission: RE | Admit: 2018-11-24 | Discharge: 2018-11-24 | Disposition: A | Discharge status: Home / Self Care | Payer: No Typology Code available for payment source | Source: Ambulatory Visit | Attending: Family Medicine | Admitting: Family Medicine

## 2018-11-24 ENCOUNTER — Ambulatory Visit (INDEPENDENT_AMBULATORY_CARE_PROVIDER_SITE_OTHER): Payer: No Typology Code available for payment source | Admitting: Family Medicine

## 2018-11-24 VITALS — BP 138/86 | HR 83 | Temp 97.2°F | Resp 12 | Ht 63.0 in | Wt 177.6 lb

## 2018-11-24 DIAGNOSIS — Z0000 Encounter for general adult medical examination without abnormal findings: Secondary | ICD-10-CM | POA: Diagnosis not present

## 2018-11-24 DIAGNOSIS — Z124 Encounter for screening for malignant neoplasm of cervix: Secondary | ICD-10-CM

## 2018-11-24 DIAGNOSIS — A6004 Herpesviral vulvovaginitis: Secondary | ICD-10-CM | POA: Diagnosis not present

## 2018-11-24 DIAGNOSIS — E559 Vitamin D deficiency, unspecified: Secondary | ICD-10-CM

## 2018-11-24 DIAGNOSIS — R928 Other abnormal and inconclusive findings on diagnostic imaging of breast: Secondary | ICD-10-CM

## 2018-11-24 DIAGNOSIS — Z1231 Encounter for screening mammogram for malignant neoplasm of breast: Secondary | ICD-10-CM

## 2018-11-24 LAB — CBC WITH DIFFERENTIAL/PLATELET
Basophils Absolute: 0 10*3/uL (ref 0.0–0.1)
Basophils Relative: 1 % (ref 0.0–3.0)
Eosinophils Absolute: 0 10*3/uL (ref 0.0–0.7)
Eosinophils Relative: 0.6 % (ref 0.0–5.0)
HCT: 41.7 % (ref 36.0–46.0)
Hemoglobin: 13.9 g/dL (ref 12.0–15.0)
Lymphocytes Relative: 39 % (ref 12.0–46.0)
Lymphs Abs: 1.7 10*3/uL (ref 0.7–4.0)
MCHC: 33.2 g/dL (ref 30.0–36.0)
MCV: 91.8 fl (ref 78.0–100.0)
Monocytes Absolute: 0.6 10*3/uL (ref 0.1–1.0)
Monocytes Relative: 13.6 % — ABNORMAL HIGH (ref 3.0–12.0)
Neutro Abs: 2 10*3/uL (ref 1.4–7.7)
Neutrophils Relative %: 45.8 % (ref 43.0–77.0)
Platelets: 302 10*3/uL (ref 150.0–400.0)
RBC: 4.54 Mil/uL (ref 3.87–5.11)
RDW: 13.1 % (ref 11.5–15.5)
WBC: 4.4 10*3/uL (ref 4.0–10.5)

## 2018-11-24 LAB — COMPREHENSIVE METABOLIC PANEL
ALT: 12 U/L (ref 0–35)
AST: 12 U/L (ref 0–37)
Albumin: 4.2 g/dL (ref 3.5–5.2)
Alkaline Phosphatase: 77 U/L (ref 39–117)
BUN: 12 mg/dL (ref 6–23)
CO2: 26 mEq/L (ref 19–32)
Calcium: 9.4 mg/dL (ref 8.4–10.5)
Chloride: 103 mEq/L (ref 96–112)
Creatinine, Ser: 1.2 mg/dL (ref 0.40–1.20)
GFR: 59.19 mL/min — ABNORMAL LOW (ref >60.00)
Glucose, Bld: 93 mg/dL (ref 70–99)
Potassium: 4.1 mEq/L (ref 3.5–5.1)
Sodium: 137 mEq/L (ref 135–145)
Total Bilirubin: 0.6 mg/dL (ref 0.2–1.2)
Total Protein: 7.4 g/dL (ref 6.0–8.3)

## 2018-11-24 LAB — LIPID PANEL
Cholesterol: 259 mg/dL — ABNORMAL HIGH (ref 0–200)
HDL: 64.9 mg/dL (ref >39.00)
LDL Cholesterol: 170 mg/dL — ABNORMAL HIGH (ref 0–99)
NonHDL: 194
Total CHOL/HDL Ratio: 4
Triglycerides: 120 mg/dL (ref 0.0–149.0)
VLDL: 24 mg/dL (ref 0.0–40.0)

## 2018-11-24 LAB — TSH: TSH: 1.7 u[IU]/mL (ref 0.35–4.50)

## 2018-11-24 LAB — VITAMIN D 25 HYDROXY (VIT D DEFICIENCY, FRACTURES): VITD: 37.5 ng/mL (ref 30.00–100.00)

## 2018-11-24 MED ORDER — VALACYCLOVIR HCL 1 G PO TABS: 1000.0000 mg | ORAL_TABLET | Freq: Every day | ORAL | 11 refills | Status: DC

## 2018-11-24 MED FILL — valACYclovir HCL 1 GM TABS: 1 | 30 days supply | Qty: 30 | Fill #0

## 2018-11-24 MED FILL — FETZIMA ER 80 MG CAPSULE: 80 | 30 days supply | Qty: 30 | Fill #7

## 2018-11-24 NOTE — Patient Instructions (Signed)

## 2018-11-24 NOTE — Progress Notes (Signed)
Subjective:     Crystal Sandoval is a 43 y.o. female and is here for a comprehensive physical exam. The patient reports no problems.  Social History   Socioeconomic History  . Marital status: Married    Spouse name: Dorian Heckle  . Number of children: Not on file  . Years of education: Not on file  . Highest education level: Not on file  Occupational History  . 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 Financial resource strain: Not on file  . Food insecurity    Worry: Not on file    Inability: Not on file  . Transportation needs    Medical: Not on file    Non-medical: Not on file  Tobacco Use  . Smoking status: Never Smoker  . Smokeless tobacco: Never Used  Substance and Sexual Activity  . Alcohol use: Yes    Alcohol/week: 0.0 standard drinks    Comment: Occ  . Drug use: Never  . Sexual activity: Not on file  Lifestyle  . Physical activity    Days per week: Not on file    Minutes per session: Not on file  . Stress: Not on file  Relationships  . Social Musician on phone: Not on file    Gets together: Not on file    Attends religious service: Not on file    Active member of club or organization: Not on file    Attends meetings of clubs or organizations: Not on file    Relationship status: Not on file  . Intimate partner violence    Fear of current or ex partner: Not on file    Emotionally abused: Not on file    Physically abused: Not on file    Forced sexual activity: Not on file  Other Topics Concern  . Not on file  Social History Narrative  . Not on file   Health Maintenance  Topic Date Due  . PAP SMEAR-Modifier  12/08/2017  . TETANUS/TDAP  07/02/2027  . INFLUENZA VACCINE  Completed  . HIV Screening  Completed    The following portions of the patient's history were reviewed and updated as appropriate: She  has a past medical history of Allergy, Asthma, Constipation, Depression, Frequent  headaches, GERD (gastroesophageal reflux disease), High cholesterol, Kidney problem, Lactose intolerance, Migraine, and Vitamin D deficiency. She does not have any pertinent problems on file. She  has a past surgical history that includes Wisdom tooth extraction. Her family history includes Alcohol abuse in her mother; Breast cancer in her maternal grandmother and paternal grandmother; Depression in her father; Drug abuse in her mother; High Cholesterol in her father and mother; High blood pressure in her father. She  reports that she has never smoked. She has never used smokeless tobacco. She reports current alcohol use. She reports that she does not use drugs. She has a current medication list which includes the following prescription(s): biotin, bupropion, cholecalciferol, fetzima, valacyclovir, and vitamin b-12. Current Outpatient Medications on File Prior to Visit  Medication Sig Dispense Refill  . Biotin 1000 MCG tablet Take 1,000 mcg by mouth 3 (three) times daily.    Marland Kitchen buPROPion (WELLBUTRIN XL) 300 MG 24 hr tablet Take 1 tablet (300 mg total) by mouth daily. DUE FOR OV 90 tablet 0  . cholecalciferol (VITAMIN D) 1000 UNITS tablet Take 1,000 Units by mouth daily.      Marland Kitchen FETZIMA 80 MG  CP24 Take 1 capsule by mouth daily. 90 capsule 3  . vitamin B-12 (CYANOCOBALAMIN) 500 MCG tablet Take 500 mcg by mouth daily.     No current facility-administered medications on file prior to visit.    She is allergic to ibuprofen..  Review of Systems Review of Systems  Constitutional: Negative for activity change, appetite change and fatigue.  HENT: Negative for hearing loss, congestion, tinnitus and ear discharge.  dentist q56m Eyes: Negative for visual disturbance (see optho q1y -- vision corrected to 20/20 with glasses).  Respiratory: Negative for cough, chest tightness and shortness of breath.   Cardiovascular: Negative for chest pain, palpitations and leg swelling.  Gastrointestinal: Negative for  abdominal pain, diarrhea, constipation and abdominal distention.  Genitourinary: Negative for urgency, frequency, decreased urine volume and difficulty urinating.  Musculoskeletal: Negative for back pain, arthralgias and gait problem.  Skin: Negative for color change, pallor and rash.  Neurological: Negative for dizziness, light-headedness, numbness and headaches.  Hematological: Negative for adenopathy. Does not bruise/bleed easily.  Psychiatric/Behavioral: Negative for suicidal ideas, confusion, sleep disturbance, self-injury, dysphoric mood, decreased concentration and agitation.       Objective:    BP 138/86 (BP Location: Right Arm, Cuff Size: Normal)   Pulse 83   Temp (!) 97.2 F (36.2 C) (Temporal)   Resp 12   Ht 5\' 3"  (1.6 m)   Wt 177 lb 9.6 oz (80.6 kg)   LMP 10/28/2018   SpO2 98%   BMI 31.46 kg/m  General appearance: alert, cooperative, appears stated age and no distress Head: Normocephalic, without obvious abnormality, atraumatic Eyes: negative findings: lids and lashes normal, conjunctivae and sclerae normal and pupils equal, round, reactive to light and accomodation Ears: normal TM's and external ear canals both ears Nose: Nares normal. Septum midline. Mucosa normal. No drainage or sinus tenderness. Throat: lips, mucosa, and tongue normal; teeth and gums normal Neck: no adenopathy, no carotid bruit, no JVD, supple, symmetrical, trachea midline and thyroid not enlarged, symmetric, no tenderness/mass/nodules Back: symmetric, no curvature. ROM normal. No CVA tenderness. Lungs: clear to auscultation bilaterally Breasts: normal appearance, no masses or tenderness Heart: regular rate and rhythm, S1, S2 normal, no murmur, click, rub or gallop Abdomen: soft, non-tender; bowel sounds normal; no masses,  no organomegaly Pelvic: cervix normal in appearance, external genitalia normal, no adnexal masses or tenderness, no cervical motion tenderness, rectovaginal septum normal,  uterus normal size, shape, and consistency, vagina normal without discharge and pap done Extremities: extremities normal, atraumatic, no cyanosis or edema Pulses: 2+ and symmetric Skin: Skin color, texture, turgor normal. No rashes or lesions Lymph nodes: Cervical, supraclavicular, and axillary nodes normal. Neurologic: Alert and oriented X 3, normal strength and tone. Normal symmetric reflexes. Normal coordination and gait    Assessment:    Healthy female exam.      Plan:     ghm utd Check labs  See After Visit Summary for Counseling Recommendations    1. Cervical cancer screening  - Cytology - PAP( [place])  2. Herpes simplex vulvovaginitis  - valACYclovir (VALTREX) 1000 MG tablet; Take 1 tablet (1,000 mg total) by mouth daily.  Dispense: 30 tablet; Refill: 11  3. Vitamin D deficiency  - Vitamin D (25 hydroxy)  4. Preventative health care ghm utd Check labs see AVS - Lipid panel - CBC with Differential - TSH - Comprehensive metabolic panel

## 2018-11-24 NOTE — Addendum Note (Signed)
Addended by: Roma Schanz R on: 11/24/2018 04:16 PM   Modules accepted: Orders

## 2018-11-26 LAB — CYTOLOGY - PAP
Comment: NEGATIVE
Diagnosis: NEGATIVE
High risk HPV: NEGATIVE

## 2018-11-29 ENCOUNTER — Other Ambulatory Visit: Payer: Self-pay | Admitting: Family Medicine

## 2018-11-29 DIAGNOSIS — E785 Hyperlipidemia, unspecified: Secondary | ICD-10-CM

## 2018-12-01 ENCOUNTER — Other Ambulatory Visit: Payer: Self-pay | Admitting: Family Medicine

## 2018-12-01 ENCOUNTER — Telehealth: Payer: Self-pay | Admitting: Family Medicine

## 2018-12-01 NOTE — Telephone Encounter (Signed)
[name], from the [facility], called and is needing more information on the referral placed. [name] states she is needing the mammogram notes that ruled it as abnormal. Please advise .  [phone]

## 2018-12-02 NOTE — Telephone Encounter (Signed)
Left VM. Waiting on possible call back from [facility].

## 2019-01-13 ENCOUNTER — Other Ambulatory Visit: Payer: Self-pay | Admitting: Family Medicine

## 2019-01-13 DIAGNOSIS — F320 Major depressive disorder, single episode, mild: Secondary | ICD-10-CM

## 2019-01-13 MED FILL — FETZIMA ER 80 MG CAPSULE: 80 | 30 days supply | Qty: 30 | Fill #8

## 2019-01-14 MED FILL — BUPROPION HCL ER (XL) 300 M: 300 | 90 days supply | Qty: 90 | Fill #0

## 2019-02-02 DIAGNOSIS — H52212 Irregular astigmatism, left eye: Secondary | ICD-10-CM | POA: Diagnosis not present

## 2019-02-02 DIAGNOSIS — H5213 Myopia, bilateral: Secondary | ICD-10-CM | POA: Diagnosis not present

## 2019-02-02 DIAGNOSIS — H18612 Keratoconus, stable, left eye: Secondary | ICD-10-CM | POA: Diagnosis not present

## 2019-02-12 ENCOUNTER — Telehealth: Payer: BC Managed Care – PPO | Admitting: Physician Assistant

## 2019-02-12 DIAGNOSIS — R300 Dysuria: Secondary | ICD-10-CM | POA: Diagnosis not present

## 2019-02-12 MED ORDER — CEPHALEXIN 500 MG PO CAPS: 500.0000 mg | ORAL_CAPSULE | Freq: Two times a day (BID) | ORAL | 0 refills | Status: AC

## 2019-02-12 NOTE — Progress Notes (Signed)

## 2019-02-24 ENCOUNTER — Other Ambulatory Visit: Payer: Self-pay | Admitting: Family Medicine

## 2019-02-24 DIAGNOSIS — F329 Major depressive disorder, single episode, unspecified: Secondary | ICD-10-CM

## 2019-02-24 DIAGNOSIS — F32A Depression, unspecified: Secondary | ICD-10-CM

## 2019-02-24 MED FILL — FETZIMA ER 80 MG CAPSULE: 80 | 30 days supply | Qty: 30 | Fill #0

## 2019-04-07 MED FILL — FETZIMA ER 80 MG CAPSULE: 80 | 30 days supply | Qty: 30 | Fill #1

## 2019-04-17 ENCOUNTER — Encounter (HOSPITAL_COMMUNITY): Payer: Self-pay

## 2019-04-17 ENCOUNTER — Emergency Department (HOSPITAL_COMMUNITY)
Admission: EM | Admit: 2019-04-17 | Discharge: 2019-04-17 | Disposition: A | Discharge status: Home / Self Care | Payer: BC Managed Care – PPO | Attending: Emergency Medicine | Admitting: Emergency Medicine

## 2019-04-17 ENCOUNTER — Other Ambulatory Visit: Payer: Self-pay

## 2019-04-17 DIAGNOSIS — S0992XA Unspecified injury of nose, initial encounter: Secondary | ICD-10-CM | POA: Insufficient documentation

## 2019-04-17 DIAGNOSIS — S0990XA Unspecified injury of head, initial encounter: Secondary | ICD-10-CM

## 2019-04-17 DIAGNOSIS — W2209XA Striking against other stationary object, initial encounter: Secondary | ICD-10-CM | POA: Diagnosis not present

## 2019-04-17 DIAGNOSIS — I10 Essential (primary) hypertension: Secondary | ICD-10-CM | POA: Insufficient documentation

## 2019-04-17 DIAGNOSIS — Y990 Civilian activity done for income or pay: Secondary | ICD-10-CM | POA: Diagnosis not present

## 2019-04-17 DIAGNOSIS — J45909 Unspecified asthma, uncomplicated: Secondary | ICD-10-CM | POA: Diagnosis not present

## 2019-04-17 DIAGNOSIS — Y9259 Other trade areas as the place of occurrence of the external cause: Secondary | ICD-10-CM | POA: Insufficient documentation

## 2019-04-17 DIAGNOSIS — Z79899 Other long term (current) drug therapy: Secondary | ICD-10-CM | POA: Insufficient documentation

## 2019-04-17 DIAGNOSIS — Y939 Activity, unspecified: Secondary | ICD-10-CM | POA: Diagnosis not present

## 2019-04-17 MED ORDER — ONDANSETRON 4 MG PO TBDP
4.0000 mg | ORAL_TABLET | Freq: Once | ORAL | Status: DC
  Filled 2019-04-17: qty 1

## 2019-04-17 MED ORDER — ACETAMINOPHEN 500 MG PO TABS
1000.0000 mg | ORAL_TABLET | Freq: Once | ORAL | Status: DC
  Filled 2019-04-17: qty 2

## 2019-04-17 NOTE — ED Provider Notes (Signed)
[facility] EMERGENCY DEPARTMENT Provider Note   CSN: [id number] Arrival date & time: 04/17/19  1302     History Chief Complaint  Patient presents with  . Fall  . Head Injury    Crystal Sandoval is a 44 y.o. female who presents for evaluation after made minor head injury that occurred earlier this afternoon.  Patient reports that she was at work and was coming out of the bathroom when she accidentally walked into a glass door at [employer].  Patient reports that she hit her face and head against a glass.  No LOC.  She states that she was stunned initially and was slightly disoriented.  She went back up to work and had an episode where she saw stars.  Her employer sent her down here for further evaluation.  On ED arrival, she states that the disorientation and seeing stars has resolved.  She states she has a slight headache.  She has not taken any medications for the symptoms.  She is not currently on blood thinners.  She did initially have a nosebleed but she states that has since resolved.  She does have some mild swelling pain noted to the nose.  She denies any vision changes, numbness/weakness of arms or legs, nausea/vomiting.  The history is provided by the patient.       Past Medical History:  Diagnosis Date  . Allergy   . Asthma   . Constipation   . Depression   . Frequent headaches   . GERD (gastroesophageal reflux disease)   . High cholesterol   . Kidney problem   . Lactose intolerance   . Migraine   . Vitamin D deficiency     Patient Active Problem List   Diagnosis Date Noted  . Essential hypertension 05/29/2018  . Prediabetes 03/11/2018  . Class 1 obesity with serious comorbidity and body mass index (BMI) of 30.0 to 30.9 in adult 02/19/2018  . Lymphadenopathy 07/01/2017  . Obesity (BMI 30.0-34.9) 07/18/2015  . HLD (hyperlipidemia) 12/12/2013  . Avitaminosis D 02/19/2013  . Major depressive disorder 10/12/2011    Past Surgical History:  Procedure  Laterality Date  . WISDOM TOOTH EXTRACTION       OB History    Gravida  0   Para  0   Term  0   Preterm  0   AB  0   Living  0     SAB  0   TAB  0   Ectopic  0   Multiple  0   Live Births  0           Family History  Problem Relation Age of Onset  . Alcohol abuse Mother   . Drug abuse Mother   . High Cholesterol Mother   . High blood pressure Father   . High Cholesterol Father   . Depression Father   . Breast cancer Maternal Grandmother   . Breast cancer Paternal Grandmother     Social History   Tobacco Use  . Smoking status: Never Smoker  . Smokeless tobacco: Never Used  Substance Use Topics  . Alcohol use: Yes    Alcohol/week: 0.0 standard drinks    Comment: Occ  . Drug use: Never    Home Medications Prior to Admission medications   Medication Sig Start Date End Date Taking? Authorizing Provider  Biotin 1000 MCG tablet Take 1,000 mcg by mouth 3 (three) times daily.    [provider]  buPROPion (WELLBUTRIN XL) 300  MG 24 hr tablet TAKE 1 TABLET BY MOUTH DAILY **NEEDS OFFICE VISIT** 01/14/19   Zola Button, Grayling Congress, DO  cholecalciferol (VITAMIN D) 1000 UNITS tablet Take 1,000 Units by mouth daily.      [provider]  FETZIMA 80 MG CP24 TAKE 1 CAPSULE BY MOUTH DAILY. 02/24/19   Donato Schultz, DO  valACYclovir (VALTREX) 1000 MG tablet Take 1 tablet (1,000 mg total) by mouth daily. 11/24/18   Donato Schultz, DO  vitamin B-12 (CYANOCOBALAMIN) 500 MCG tablet Take 500 mcg by mouth daily.    [provider]    Allergies    Ibuprofen  Review of Systems   Review of Systems  Eyes: Negative for visual disturbance.  Cardiovascular: Negative for chest pain.  Gastrointestinal: Positive for nausea. Negative for vomiting.  Neurological: Positive for headaches. Negative for weakness and numbness.  All other systems reviewed and are negative.   Physical Exam Updated Vital Signs BP (!) 181/107   Pulse 92   Temp  98.2 F (36.8 C) (Oral)   Resp 18   SpO2 100%   Physical Exam Vitals and nursing note reviewed.  Constitutional:      Appearance: Normal appearance. She is well-developed.  HENT:     Head: Normocephalic.     Comments: No tenderness to palpation of skull. No deformities or crepitus noted. No open wounds, abrasions or lacerations.     Right Ear: No hemotympanum.     Left Ear: No hemotympanum.     Nose:     Right Nostril: No septal hematoma.     Left Nostril: No septal hematoma.      Comments: Diffuse soft tissue swelling noted to the nasal bridge.  No deformity or crepitus noted.  No septal hematoma noted bilaterally. Eyes:     General: Lids are normal.     Conjunctiva/sclera: Conjunctivae normal.     Pupils: Pupils are equal, round, and reactive to light.     Comments: PERRL. EOMs intact. No nystagmus. No neglect.   Cardiovascular:     Rate and Rhythm: Normal rate and regular rhythm.     Pulses: Normal pulses.     Heart sounds: Normal heart sounds. No murmur. No friction rub. No gallop.   Pulmonary:     Effort: Pulmonary effort is normal.     Breath sounds: Normal breath sounds.  Abdominal:     Palpations: Abdomen is soft. Abdomen is not rigid.     Tenderness: There is no abdominal tenderness. There is no guarding.  Musculoskeletal:        General: Normal range of motion.     Cervical back: Full passive range of motion without pain.  Skin:    General: Skin is warm and dry.     Capillary Refill: Capillary refill takes less than 2 seconds.  Neurological:     Mental Status: She is alert and oriented to person, place, and time.     Comments: Cranial nerves III-XII intact Follows commands, Moves all extremities  5/5 strength to BUE and BLE  Sensation intact throughout all major nerve distributions No gait abnormalities  No slurred speech. No facial droop.   Psychiatric:        Speech: Speech normal.     ED Results / Procedures / Treatments   Labs (all labs ordered are  listed, but only abnormal results are displayed) Labs Reviewed - No data to display  EKG None  Radiology No results found.  Procedures Procedures (including  critical care time)  Medications Ordered in ED Medications  acetaminophen (TYLENOL) tablet 1,000 mg (500 mg Oral Refused 04/17/19 1441)  ondansetron (ZOFRAN-ODT) disintegrating tablet 4 mg (4 mg Oral Refused 04/17/19 1441)    ED Course  I have reviewed the triage vital signs and the nursing notes.  Pertinent labs & imaging results that were available during my care of the patient were reviewed by me and considered in my medical decision making (see chart for details).    MDM Rules/Calculators/A&P                      44 year old female who presents for evaluation after minor head injury.  She reports she ran into a glass wall.  No LOC.  She is on a blood thinners.  No nausea/vomiting.  Initial ED arrival, she is afebrile, nontoxic-appearing.  Vital signs are stable.  No neuro deficits noted on exam.  No red flags. Given reassuring physical exam and per [facility] CT criteria, no imaging is indicated at this time.  I did discuss with her minor head injury precautions.  I discussed with her that she could still have some postconcussive syndrome.  Additionally, she has some swelling of her nose.  No obvious crepitus or deformity.  No septal hematoma noted.  We will plan to give her ear nose and throat referral for further evaluation of her symptoms do not improve. At this time, patient exhibits no emergent life-threatening condition that require further evaluation in ED or admission. Patient had ample opportunity for questions and discussion. All patient's questions were answered with full understanding. Strict return precautions discussed. Patient expresses understanding and agreement to plan.   Portions of this note were generated with Scientist, clinical (histocompatibility and immunogenetics). Dictation errors may occur despite best attempts at proofreading.    Final Clinical Impression(s) / ED Diagnoses Final diagnoses:  Minor head injury, initial encounter  Injury of nose, initial encounter    Rx / DC Orders ED Discharge Orders    None       Rosana Hoes 04/17/19 1903    Gwyneth Sprout, MD 04/18/19 2124

## 2019-04-17 NOTE — ED Triage Notes (Signed)
Pt walked into a glass door while at [employer] today, pt hit her head pretty hard against the glass, no LOC, no open wounds noted, pt initially confused. Pt alert and oriented in triage, states she is seeing stars. Pt is employee in [facility].

## 2019-04-17 NOTE — Discharge Instructions (Signed)
As we discussed, your exam is reassuring.  You may have some postconcussion syndrome.  This includes difficulty concentrating, fatigue, memory issues.  This can sometimes last 2 to 4 weeks.  You should not engage in any physical activity for 2 weeks.  Additionally, engaging in brain rest, including limited TV, screen, computer time may help.  Follow-up with your primary care doctor.  As we discussed, if you still have pain, swelling of your nose in about a 1 week, follow-up with ear nose and throat doctor.  Return the emergency department for any confusion, difficulty speaking, difficulty walking, numbness/weakness of your arms or legs, nausea/vomiting or any other worsening concerning symptoms.

## 2019-06-11 DIAGNOSIS — L819 Disorder of pigmentation, unspecified: Secondary | ICD-10-CM | POA: Diagnosis not present

## 2019-06-18 ENCOUNTER — Encounter: Payer: Self-pay | Admitting: Family Medicine

## 2019-06-18 ENCOUNTER — Ambulatory Visit (INDEPENDENT_AMBULATORY_CARE_PROVIDER_SITE_OTHER): Payer: BC Managed Care – PPO | Admitting: Family Medicine

## 2019-06-18 ENCOUNTER — Other Ambulatory Visit: Payer: Self-pay

## 2019-06-18 VITALS — BP 138/82 | HR 79 | Temp 97.3°F | Resp 18 | Ht 63.0 in | Wt 185.0 lb

## 2019-06-18 DIAGNOSIS — Z111 Encounter for screening for respiratory tuberculosis: Secondary | ICD-10-CM | POA: Diagnosis not present

## 2019-06-18 DIAGNOSIS — Z0000 Encounter for general adult medical examination without abnormal findings: Secondary | ICD-10-CM | POA: Diagnosis not present

## 2019-06-18 NOTE — Progress Notes (Signed)
Patient ID: Crystal Sandoval, female    DOB: Dec 09, 1975  Age: 44 y.o. MRN: [id number]    Subjective:  Subjective  HPI Crystal Sandoval presents for office visit for TB screening and to update any other vaccines that may be needed   Review of Systems  Constitutional: Negative for appetite change, diaphoresis, fatigue and unexpected weight change.  Eyes: Negative for pain, redness and visual disturbance.  Respiratory: Negative for cough, chest tightness, shortness of breath and wheezing.   Cardiovascular: Negative for chest pain, palpitations and leg swelling.  Endocrine: Negative for cold intolerance, heat intolerance, polydipsia, polyphagia and polyuria.  Genitourinary: Negative for difficulty urinating, dysuria and frequency.  Neurological: Negative for dizziness, light-headedness, numbness and headaches.    History Past Medical History:  Diagnosis Date  . Allergy   . Asthma   . Constipation   . Depression   . Frequent headaches   . GERD (gastroesophageal reflux disease)   . High cholesterol   . Kidney problem   . Lactose intolerance   . Migraine   . Vitamin D deficiency     She has a past surgical history that includes Wisdom tooth extraction.   Her family history includes Alcohol abuse in her mother; Breast cancer in her maternal grandmother and paternal grandmother; Depression in her father; Drug abuse in her mother; High Cholesterol in her father and mother; High blood pressure in her father.She reports that she has never smoked. She has never used smokeless tobacco. She reports current alcohol use. She reports that she does not use drugs.  Current Outpatient Medications on File Prior to Visit  Medication Sig Dispense Refill  . Biotin 1000 MCG tablet Take 1,000 mcg by mouth 3 (three) times daily.    Marland Kitchen buPROPion (WELLBUTRIN XL) 300 MG 24 hr tablet TAKE 1 TABLET BY MOUTH DAILY **NEEDS OFFICE VISIT** 90 tablet 1  . cholecalciferol (VITAMIN D) 1000 UNITS tablet Take 1,000 Units by  mouth daily.      Marland Kitchen FETZIMA 80 MG CP24 TAKE 1 CAPSULE BY MOUTH DAILY. 30 capsule 3  . valACYclovir (VALTREX) 1000 MG tablet Take 1 tablet (1,000 mg total) by mouth daily. 30 tablet 11  . vitamin B-12 (CYANOCOBALAMIN) 500 MCG tablet Take 500 mcg by mouth daily.     No current facility-administered medications on file prior to visit.     Objective:  Objective  Physical Exam Vitals and nursing note reviewed.  Constitutional:      Appearance: She is well-developed.  HENT:     Head: Normocephalic and atraumatic.  Eyes:     Conjunctiva/sclera: Conjunctivae normal.  Neck:     Thyroid: No thyromegaly.     Vascular: No carotid bruit or JVD.  Cardiovascular:     Rate and Rhythm: Normal rate and regular rhythm.     Heart sounds: Normal heart sounds. No murmur heard.   Pulmonary:     Effort: Pulmonary effort is normal. No respiratory distress.     Breath sounds: Normal breath sounds. No wheezing or rales.  Chest:     Chest wall: No tenderness.  Musculoskeletal:     Cervical back: Normal range of motion and neck supple.  Neurological:     Mental Status: She is alert and oriented to person, place, and time.    BP 138/82 (BP Location: Right Arm, Patient Position: Sitting, Cuff Size: Large)   Pulse 79   Temp (!) 97.3 F (36.3 C) (Temporal)   Resp 18   Ht 5\' 3"  (1.6 m)  Wt 185 lb (83.9 kg)   SpO2 100%   BMI 32.77 kg/m  Wt Readings from Last 3 Encounters:  06/18/19 185 lb (83.9 kg)  11/24/18 177 lb 9.6 oz (80.6 kg)  03/27/18 168 lb (76.2 kg)     Lab Results  Component Value Date   WBC 4.4 11/24/2018   HGB 13.9 11/24/2018   HCT 41.7 11/24/2018   PLT 302.0 11/24/2018   GLUCOSE 93 11/24/2018   CHOL 259 (H) 11/24/2018   TRIG 120.0 11/24/2018   HDL 64.90 11/24/2018   LDLCALC 170 (H) 11/24/2018   ALT 12 11/24/2018   AST 12 11/24/2018   NA 137 11/24/2018   K 4.1 11/24/2018   CL 103 11/24/2018   CREATININE 1.20 11/24/2018   BUN 12 11/24/2018   CO2 26 11/24/2018   TSH  1.70 11/24/2018   HGBA1C 5.7 (H) 02/18/2018    No results found.   Assessment & Plan:  Plan  I am having Lissa Hoffert maintain her cholecalciferol, Biotin, vitamin B-12, valACYclovir, buPROPion, and Fetzima.  No orders of the defined types were placed in this encounter.   Problem List Items Addressed This Visit    None    Visit Diagnoses    Visit for TB skin test    -  Primary   Relevant Orders   QuantiFERON-TB Gold Plus    vaccines utd  quantiferon- TB gold plus done   Follow-up: Return in about 6 months (around 12/18/2019) for annual exam, fasting.  Donato Schultz, DO

## 2019-06-20 ENCOUNTER — Telehealth: Payer: BC Managed Care – PPO | Admitting: Physician Assistant

## 2019-06-20 DIAGNOSIS — R399 Unspecified symptoms and signs involving the genitourinary system: Secondary | ICD-10-CM | POA: Diagnosis not present

## 2019-06-20 LAB — QUANTIFERON-TB GOLD PLUS
Mitogen-NIL: >10 IU/mL
NIL: 0.03 IU/mL
QuantiFERON-TB Gold Plus: NEGATIVE
TB1-NIL: 0 IU/mL
TB2-NIL: 0 IU/mL

## 2019-06-20 MED ORDER — CEPHALEXIN 500 MG PO CAPS: 500.0000 mg | ORAL_CAPSULE | Freq: Two times a day (BID) | ORAL | 0 refills | Status: AC

## 2019-06-20 NOTE — Progress Notes (Signed)

## 2019-07-02 MED FILL — FETZIMA ER 80 MG CAPSULE: 80 | 30 days supply | Qty: 30 | Fill #3

## 2019-08-18 ENCOUNTER — Other Ambulatory Visit: Payer: Self-pay | Admitting: Family Medicine

## 2019-08-18 DIAGNOSIS — F32A Depression, unspecified: Secondary | ICD-10-CM

## 2019-08-18 MED FILL — FETZIMA ER 80 MG CAPSULE: 80 | 30 days supply | Qty: 30 | Fill #0

## 2019-10-09 MED FILL — FETZIMA ER 80 MG CAPSULE: 80 | 30 days supply | Qty: 30 | Fill #1

## 2019-10-10 ENCOUNTER — Ambulatory Visit (INDEPENDENT_AMBULATORY_CARE_PROVIDER_SITE_OTHER): Payer: BC Managed Care – PPO

## 2019-10-10 ENCOUNTER — Encounter (HOSPITAL_COMMUNITY): Payer: Self-pay

## 2019-10-10 ENCOUNTER — Other Ambulatory Visit: Payer: Self-pay

## 2019-10-10 ENCOUNTER — Ambulatory Visit (HOSPITAL_COMMUNITY)
Admission: EM | Admit: 2019-10-10 | Discharge: 2019-10-10 | Disposition: A | Discharge status: Home / Self Care | Payer: BC Managed Care – PPO | Attending: Emergency Medicine | Admitting: Emergency Medicine

## 2019-10-10 DIAGNOSIS — Z8616 Personal history of COVID-19: Secondary | ICD-10-CM | POA: Diagnosis not present

## 2019-10-10 DIAGNOSIS — R0602 Shortness of breath: Secondary | ICD-10-CM

## 2019-10-10 MED ORDER — PREDNISONE 50 MG PO TABS: 50.0000 mg | ORAL_TABLET | Freq: Every day | ORAL | 0 refills | Status: AC

## 2019-10-10 NOTE — Discharge Instructions (Signed)
Chest xray normal Continue albuterol Begin prednisone daily for 5 days If worsening follow up in emergency room

## 2019-10-10 NOTE — ED Provider Notes (Signed)
[facility]    CSN: [id number] Arrival date & time: 10/10/19  1006      History   Chief Complaint Chief Complaint  Patient presents with  . Shortness of Breath    HPI Crystal Sandoval is a 44 y.o. female history of asthma, GERD, presenting today for evaluation of shortness of breath.  Patient recently recovered from Covid and was cleared to return to work on 9/28.  She has continued to have shortness of breath especially with exertion.  Does report history of asthma and has been using her albuterol inhaler as needed.  Denies any wheezing.  Denies prior DVT/PE.  Denies leg pain or leg swelling.  Denies tobacco use.  Denies recent travel immobilization or surgery.  Cough is improved.  Denies any fevers.  HPI  Past Medical History:  Diagnosis Date  . Allergy   . Asthma   . Constipation   . Depression   . Frequent headaches   . GERD (gastroesophageal reflux disease)   . High cholesterol   . Kidney problem   . Lactose intolerance   . Migraine   . Vitamin D deficiency     Patient Active Problem List   Diagnosis Date Noted  . Essential hypertension 05/29/2018  . Prediabetes 03/11/2018  . Class 1 obesity with serious comorbidity and body mass index (BMI) of 30.0 to 30.9 in adult 02/19/2018  . Lymphadenopathy 07/01/2017  . Obesity (BMI 30.0-34.9) 07/18/2015  . HLD (hyperlipidemia) 12/12/2013  . Avitaminosis D 02/19/2013  . Major depressive disorder 10/12/2011    Past Surgical History:  Procedure Laterality Date  . WISDOM TOOTH EXTRACTION      OB History    Gravida  0   Para  0   Term  0   Preterm  0   AB  0   Living  0     SAB  0   TAB  0   Ectopic  0   Multiple  0   Live Births  0            Home Medications    Prior to Admission medications   Medication Sig Start Date End Date Taking? Authorizing Provider  Biotin 1000 MCG tablet Take 1,000 mcg by mouth 3 (three) times daily.    [provider]  buPROPion (WELLBUTRIN XL)  300 MG 24 hr tablet TAKE 1 TABLET BY MOUTH DAILY **NEEDS OFFICE VISIT** 01/14/19   Zola Button, [place] Congress, DO  cholecalciferol (VITAMIN D) 1000 UNITS tablet Take 1,000 Units by mouth daily.      [provider]  FETZIMA 80 MG CP24 TAKE 1 CAPSULE BY MOUTH DAILY. 08/18/19   Donato Schultz, DO  predniSONE (DELTASONE) 50 MG tablet Take 1 tablet (50 mg total) by mouth daily with breakfast for 5 days. 10/10/19 10/15/19  Shel[place] Apgar C, PA-C  valACYclovir (VALTREX) 1000 MG tablet Take 1 tablet (1,000 mg total) by mouth daily. 11/24/18   Donato Schultz, DO  vitamin B-12 (CYANOCOBALAMIN) 500 MCG tablet Take 500 mcg by mouth daily.    [provider]    Family History Family History  Problem Relation Age of Onset  . Alcohol abuse Mother   . Drug abuse Mother   . High Cholesterol Mother   . High blood pressure Father   . High Cholesterol Father   . Depression Father   . Breast cancer Maternal Grandmother   . Breast cancer Paternal Grandmother     Social History  Social History   Tobacco Use  . Smoking status: Never Smoker  . Smokeless tobacco: Never Used  Substance Use Topics  . Alcohol use: Yes    Alcohol/week: 0.0 standard drinks    Comment: Occ  . Drug use: Never     Allergies   Ibuprofen   Review of Systems Review of Systems  Constitutional: Negative for activity change, appetite change, chills, fatigue and fever.  HENT: Negative for congestion, ear pain, rhinorrhea, sinus pressure, sore throat and trouble swallowing.   Eyes: Negative for discharge and redness.  Respiratory: Positive for cough and shortness of breath. Negative for chest tightness.   Cardiovascular: Negative for chest pain.  Gastrointestinal: Negative for abdominal pain, diarrhea, nausea and vomiting.  Musculoskeletal: Negative for myalgias.  Skin: Negative for rash.  Neurological: Negative for dizziness, light-headedness and headaches.     Physical Exam Triage Vital Signs ED  Triage Vitals  Enc Vitals Group     BP      Pulse      Resp      Temp      Temp src      SpO2      Weight      Height      Head Circumference      Peak Flow      Pain Score      Pain Loc      Pain Edu?      Excl. in GC?    No data found.  Updated Vital Signs BP (!) 152/91 (BP Location: Left Arm)   Pulse 100   Temp 99 F (37.2 C) (Oral)   Resp 16   LMP 09/16/2019   SpO2 100%   Visual Acuity Right Eye Distance:   Left Eye Distance:   Bilateral Distance:    Right Eye Near:   Left Eye Near:    Bilateral Near:     Physical Exam Vitals and nursing note reviewed.  Constitutional:      Appearance: She is well-developed.     Comments: No acute distress  HENT:     Head: Normocephalic and atraumatic.     Ears:     Comments: Bilateral ears without tenderness to palpation of external auricle, tragus and mastoid, EAC's without erythema or swelling, TM's with good bony landmarks and cone of light. Non erythematous.     Nose: Nose normal.     Mouth/Throat:     Comments: Oral mucosa pink and moist, no tonsillar enlargement or exudate. Posterior pharynx patent and nonerythematous, no uvula deviation or swelling. Normal phonation. Eyes:     Conjunctiva/sclera: Conjunctivae normal.  Cardiovascular:     Rate and Rhythm: Normal rate.  Pulmonary:     Effort: Pulmonary effort is normal. No respiratory distress.     Comments: Breathing comfortably at rest, CTABL, no wheezing, rales or other adventitious sounds auscultated Abdominal:     General: There is no distension.  Musculoskeletal:        General: Normal range of motion.     Cervical back: Neck supple.     Comments: Bilateral lower legs symmetric without calf swelling or tenderness  Skin:    General: Skin is warm and dry.  Neurological:     Mental Status: She is alert and oriented to person, place, and time.      UC Treatments / Results  Labs (all labs ordered are listed, but only abnormal results are  displayed) Labs Reviewed - No data to display  EKG  Radiology DG Chest 2 View  Result Date: 10/10/2019 CLINICAL DATA:  Pt presents with shortness of breath with exertion; pt states she recovered from covid on 09/28 EXAM: CHEST - 2 VIEW COMPARISON:  None. FINDINGS: The cardiomediastinal contours are within normal limits. The lungs are clear. No pneumothorax or pleural effusion. No acute finding in the visualized skeleton. IMPRESSION: No acute cardiopulmonary finding. Electronically Signed   By: Emmaline Kluver M.D.   On: 10/10/2019 11:08    Procedures Procedures (including critical care time)  Medications Ordered in UC Medications - No data to display  Initial Impression / Assessment and Plan / UC Course  I have reviewed the triage vital signs and the nursing notes.  Pertinent labs & imaging results that were available during my care of the patient were reviewed by me and considered in my medical decision making (see chart for details).     Chest x-ray unremarkable.  O2 100%, heart rate is borderline tachycardia, but has recently been using albuterol.  Negative risk factors for DVT/PE but did recently have Covid.  Opting to treat with course of prednisone along with continuing albuterol, symptoms progressing or worsening follow-up in emergency room for CT scan to rule out PE.  Discussed strict return precautions. Patient verbalized understanding and is agreeable with plan.  Final Clinical Impressions(s) / UC Diagnoses   Final diagnoses:  SOB (shortness of breath)     Discharge Instructions     Chest xray normal Continue albuterol Begin prednisone daily for 5 days If worsening follow up in emergency room     ED Prescriptions    Medication Sig Dispense Auth. Provider   predniSONE (DELTASONE) 50 MG tablet Take 1 tablet (50 mg total) by mouth daily with breakfast for 5 days. 5 tablet Nahshon Reich, [place] C, PA-C     PDMP not reviewed this encounter.   Lew Dawes,  PA-C 10/10/19 1126

## 2019-10-10 NOTE — ED Triage Notes (Signed)
Pt presents with shortness of breath with exertion; pt states she recovered from covid on 09/28   Pt has diminished breath sounds on right side according to respiratory team at work

## 2019-10-30 ENCOUNTER — Other Ambulatory Visit: Payer: Self-pay | Admitting: Family Medicine

## 2019-10-30 DIAGNOSIS — F320 Major depressive disorder, single episode, mild: Secondary | ICD-10-CM

## 2019-10-30 MED FILL — buPROPion HCL ER (XL) 300 M: 300 | 30 days supply | Qty: 30 | Fill #0

## 2019-11-19 MED FILL — FETZIMA ER 80 MG CAPSULE: 80 | 30 days supply | Qty: 30 | Fill #2

## 2019-12-30 ENCOUNTER — Other Ambulatory Visit: Payer: Self-pay | Admitting: Family Medicine

## 2019-12-30 DIAGNOSIS — F320 Major depressive disorder, single episode, mild: Secondary | ICD-10-CM

## 2019-12-30 MED FILL — FETZIMA ER 80 MG CAPSULE: 80 | 30 days supply | Qty: 30 | Fill #3

## 2019-12-30 MED FILL — buPROPion HCL ER (XL) 300 M: 300 | 30 days supply | Qty: 30 | Fill #0

## 2020-02-18 ENCOUNTER — Other Ambulatory Visit: Payer: Self-pay | Admitting: Family Medicine

## 2020-02-18 DIAGNOSIS — F320 Major depressive disorder, single episode, mild: Secondary | ICD-10-CM

## 2020-02-18 DIAGNOSIS — F32A Depression, unspecified: Secondary | ICD-10-CM

## 2020-02-18 MED FILL — FETZIMA ER 80 MG CAPSULE: 80 | 30 days supply | Qty: 30 | Fill #0

## 2020-02-18 MED FILL — buPROPion HCL ER (XL) 300 M: 300 | 30 days supply | Qty: 30 | Fill #0

## 2020-03-22 ENCOUNTER — Encounter: Payer: Self-pay | Admitting: Family Medicine

## 2020-03-22 ENCOUNTER — Other Ambulatory Visit: Payer: Self-pay

## 2020-03-22 ENCOUNTER — Ambulatory Visit: Payer: BC Managed Care – PPO | Admitting: Family Medicine

## 2020-03-22 ENCOUNTER — Other Ambulatory Visit: Payer: Self-pay | Admitting: Family Medicine

## 2020-03-22 VITALS — BP 150/100 | HR 95 | Temp 99.1°F | Resp 18 | Ht 63.0 in | Wt 185.4 lb

## 2020-03-22 DIAGNOSIS — I10 Essential (primary) hypertension: Secondary | ICD-10-CM

## 2020-03-22 DIAGNOSIS — E669 Obesity, unspecified: Secondary | ICD-10-CM | POA: Diagnosis not present

## 2020-03-22 DIAGNOSIS — Z6830 Body mass index (BMI) 30.0-30.9, adult: Secondary | ICD-10-CM

## 2020-03-22 MED ORDER — METOPROLOL SUCCINATE ER 50 MG PO TB24: 50.0000 mg | ORAL_TABLET | Freq: Every day | ORAL | 1 refills | Status: DC

## 2020-03-22 NOTE — Patient Instructions (Signed)

## 2020-03-22 NOTE — Progress Notes (Signed)
Patient ID: Crystal Sandoval, female    DOB: 08/16/1975  Age: 45 y.o. 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  Eyes: Negative for pain, redness and visual disturbance.  Respiratory: Negative for cough, chest tightness, shortness of breath and wheezing.   Cardiovascular: Negative for chest pain, palpitations and leg swelling.  Endocrine: Negative for cold intolerance, heat intolerance, polydipsia, polyphagia and polyuria.  Genitourinary: Negative for difficulty urinating, dysuria and frequency.  Neurological: Positive for dizziness. Negative for light-headedness, numbness and headaches.    History Past Medical History:  Diagnosis Date  . Allergy   . Asthma   . Constipation   . Depression   . Frequent headaches   . GERD (gastroesophageal reflux disease)   . High cholesterol   . Kidney problem   . Lactose intolerance   . Migraine   . Vitamin D deficiency     She has a past surgical history that includes Wisdom tooth extraction.   Her family history includes Alcohol abuse in her mother; Breast cancer in her maternal grandmother and paternal grandmother; Depression in her father; Drug abuse in her mother; High Cholesterol in her father and mother; High blood pressure in her father; Hypertension in her maternal grandfather, maternal grandmother, paternal grandfather, and paternal grandmother.She reports that she has never smoked. She has never used smokeless tobacco. She reports current alcohol use. She reports that she does not use drugs.  Current Outpatient Medications on File Prior to Visit  Medication Sig Dispense Refill  . Biotin 1000 MCG tablet Take  1,000 mcg by mouth 3 (three) times daily.    Marland Kitchen buPROPion (WELLBUTRIN XL) 300 MG 24 hr tablet TAKE 1 TABLET (300 MG TOTAL) BY MOUTH DAILY. *NEED APPT* 30 tablet 1  . cholecalciferol (VITAMIN D) 1000 UNITS tablet Take 1,000 Units by mouth daily.    Marland Kitchen FETZIMA 80 MG CP24 TAKE 1 CAPSULE BY MOUTH DAILY. 30 capsule 1  . valACYclovir (VALTREX) 1000 MG tablet Take 1 tablet (1,000 mg total) by mouth daily. 30 tablet 11  . vitamin B-12 (CYANOCOBALAMIN) 500 MCG tablet Take 500 mcg by mouth daily.     No current facility-administered medications on file prior to visit.     Objective:  Objective  Physical Exam Vitals and nursing note reviewed.  Constitutional:      Appearance: She is well-developed.  HENT:     Head: Normocephalic and atraumatic.  Eyes:     Conjunctiva/sclera: Conjunctivae normal.  Neck:     Thyroid: No thyromegaly.     Vascular: No carotid bruit or JVD.  Cardiovascular:     Rate and Rhythm: Normal rate and regular rhythm.     Heart sounds: Normal heart sounds. No murmur heard.   Pulmonary:     Effort: Pulmonary effort is normal. No respiratory distress.     Breath sounds: Normal breath sounds. No wheezing or rales.  Chest:     Chest wall: No tenderness.  Musculoskeletal:     Cervical back: Normal range of motion and neck supple.  Neurological:     Mental Status: She is alert and oriented to person, place, and time.  BP (!) 150/100 (BP Location: Right Arm, Patient Position: Sitting, Cuff Size: Normal)   Pulse 95   Temp 99.1 F (37.3 C) (Oral)   Resp 18   Ht 5\' 3"  (1.6 m)   Wt 185 lb 6.4 oz (84.1 kg)   SpO2 99%   BMI 32.84 kg/m  Wt Readings from Last 3 Encounters:  03/22/20 185 lb 6.4 oz (84.1 kg)  06/18/19 185 lb (83.9 kg)  11/24/18 177 lb 9.6 oz (80.6 kg)     Lab Results  Component Value Date   WBC 4.4 11/24/2018   HGB 13.9 11/24/2018   HCT 41.7 11/24/2018   PLT 302.0 11/24/2018   GLUCOSE 93 11/24/2018   CHOL 259 (H) 11/24/2018   TRIG 120.0  11/24/2018   HDL 64.90 11/24/2018   LDLCALC 170 (H) 11/24/2018   ALT 12 11/24/2018   AST 12 11/24/2018   NA 137 11/24/2018   K 4.1 11/24/2018   CL 103 11/24/2018   CREATININE 1.20 11/24/2018   BUN 12 11/24/2018   CO2 26 11/24/2018   TSH 1.70 11/24/2018   HGBA1C 5.7 (H) 02/18/2018  ekg-- nsr--- non to compare to --  86 bpm   DG Chest 2 View  Result Date: 10/10/2019 CLINICAL DATA:  Pt presents with shortness of breath with exertion; pt states she recovered from covid on 09/28 EXAM: CHEST - 2 VIEW COMPARISON:  None. FINDINGS: The cardiomediastinal contours are within normal limits. The lungs are clear. No pneumothorax or pleural effusion. No acute finding in the visualized skeleton. IMPRESSION: No acute cardiopulmonary finding. Electronically Signed   By: 10/28 M.D.   On: 10/10/2019 11:08     Assessment & Plan:  Plan  I am having Crystal Sandoval start on metoprolol succinate. I am also having her maintain her cholecalciferol, Biotin, vitamin B-12, valACYclovir, buPROPion, and Fetzima.  Meds ordered this encounter  Medications  . metoprolol succinate (TOPROL-XL) 50 MG 24 hr tablet    Sig: Take 1 tablet (50 mg total) by mouth daily. Take with or immediately following a meal.    Dispense:  30 tablet    Refill:  1    Problem List Items Addressed This Visit      Unprioritized   Class 1 obesity with serious comorbidity and body mass index (BMI) of 30.0 to 30.9 in adult    Pt is unable to restart HWW due to schedule  She will try to follow the diet --- we also discussed my fitness pal and 12/10/2019  Check labs       Essential hypertension    Poorly controlled will alter medications, encouraged DASH diet, minimize caffeine and obtain adequate sleep. Report concerning symptoms and follow up as directed and as needed F/u 2-3 weeks or sooner prn       Relevant Medications   metoprolol succinate (TOPROL-XL) 50 MG 24 hr tablet    Other Visit Diagnoses    Primary hypertension    -   Primary   Relevant Medications   metoprolol succinate (TOPROL-XL) 50 MG 24 hr tablet   Other Relevant Orders   Lipid panel   CBC with Differential/Platelet   Comprehensive metabolic panel   TSH   Insulin, random   Vitamin D (25 hydroxy)   EKG 12-Lead (Completed)   Morbid obesity (HCC)       Relevant Orders   Lipid panel   CBC with Differential/Platelet   Comprehensive metabolic panel   TSH   Insulin, random   Vitamin D (  25 hydroxy)      Follow-up: Return in about 2 weeks (around 04/05/2020), or if symptoms worsen or fail to improve, for hypertension.  Donato Schultz, DO

## 2020-03-23 LAB — CBC WITH DIFFERENTIAL/PLATELET
Basophils Absolute: 0 10*3/uL (ref 0.0–0.1)
Basophils Relative: 0.5 % (ref 0.0–3.0)
Eosinophils Absolute: 0.1 10*3/uL (ref 0.0–0.7)
Eosinophils Relative: 1.1 % (ref 0.0–5.0)
HCT: 41.9 % (ref 36.0–46.0)
Hemoglobin: 14.2 g/dL (ref 12.0–15.0)
Lymphocytes Relative: 29.6 % (ref 12.0–46.0)
Lymphs Abs: 1.3 10*3/uL (ref 0.7–4.0)
MCHC: 33.8 g/dL (ref 30.0–36.0)
MCV: 89 fl (ref 78.0–100.0)
Monocytes Absolute: 0.4 10*3/uL (ref 0.1–1.0)
Monocytes Relative: 8.6 % (ref 3.0–12.0)
Neutro Abs: 2.7 10*3/uL (ref 1.4–7.7)
Neutrophils Relative %: 60.2 % (ref 43.0–77.0)
Platelets: 283 10*3/uL (ref 150.0–400.0)
RBC: 4.71 Mil/uL (ref 3.87–5.11)
RDW: 12.8 % (ref 11.5–15.5)
WBC: 4.5 10*3/uL (ref 4.0–10.5)

## 2020-03-23 LAB — LIPID PANEL
Cholesterol: 249 mg/dL — ABNORMAL HIGH (ref 0–200)
HDL: 63.9 mg/dL (ref >39.00)
LDL Cholesterol: 163 mg/dL — ABNORMAL HIGH (ref 0–99)
NonHDL: 185.03
Total CHOL/HDL Ratio: 4
Triglycerides: 111 mg/dL (ref 0.0–149.0)
VLDL: 22.2 mg/dL (ref 0.0–40.0)

## 2020-03-23 LAB — COMPREHENSIVE METABOLIC PANEL
ALT: 14 U/L (ref 0–35)
AST: 15 U/L (ref 0–37)
Albumin: 4.1 g/dL (ref 3.5–5.2)
Alkaline Phosphatase: 73 U/L (ref 39–117)
BUN: 15 mg/dL (ref 6–23)
CO2: 25 mEq/L (ref 19–32)
Calcium: 9.4 mg/dL (ref 8.4–10.5)
Chloride: 104 mEq/L (ref 96–112)
Creatinine, Ser: 1.19 mg/dL (ref 0.40–1.20)
GFR: 55.49 mL/min — ABNORMAL LOW (ref >60.00)
Glucose, Bld: 97 mg/dL (ref 70–99)
Potassium: 3.8 mEq/L (ref 3.5–5.1)
Sodium: 138 mEq/L (ref 135–145)
Total Bilirubin: 0.8 mg/dL (ref 0.2–1.2)
Total Protein: 7.2 g/dL (ref 6.0–8.3)

## 2020-03-23 LAB — INSULIN, RANDOM: Insulin: 71.9 u[IU]/mL — ABNORMAL HIGH

## 2020-03-23 LAB — TSH: TSH: 0.94 u[IU]/mL (ref 0.35–4.50)

## 2020-03-23 LAB — VITAMIN D 25 HYDROXY (VIT D DEFICIENCY, FRACTURES): VITD: 29.81 ng/mL — ABNORMAL LOW (ref 30.00–100.00)

## 2020-03-23 NOTE — Assessment & Plan Note (Signed)
Pt is unable to restart HWW due to schedule  She will try to follow the diet --- we also discussed my fitness pal and [employer]  Check labs

## 2020-03-23 NOTE — Assessment & Plan Note (Signed)
Poorly controlled will alter medications, encouraged DASH diet, minimize caffeine and obtain adequate sleep. Report concerning symptoms and follow up as directed and as needed F/u 2-3 weeks or sooner prn

## 2020-03-28 ENCOUNTER — Other Ambulatory Visit: Payer: Self-pay

## 2020-03-28 DIAGNOSIS — E161 Other hypoglycemia: Secondary | ICD-10-CM

## 2020-03-28 DIAGNOSIS — E559 Vitamin D deficiency, unspecified: Secondary | ICD-10-CM

## 2020-03-28 MED ORDER — METFORMIN HCL 500 MG PO TABS: 500.0000 mg | ORAL_TABLET | Freq: Every day | ORAL | 2 refills | Status: DC

## 2020-03-28 MED ORDER — VITAMIN D (ERGOCALCIFEROL) 1.25 MG (50000 UNIT) PO CAPS: 50000.0000 [IU] | ORAL_CAPSULE | ORAL | 1 refills | Status: DC

## 2020-03-28 MED FILL — METFORMIN HCL 500 MG TABS: 500 | 30 days supply | Qty: 30 | Fill #0

## 2020-03-28 MED FILL — VIT D2 1.25 MG (50,000 UNIT: 1.25 MG | 84 days supply | Qty: 12 | Fill #0

## 2020-04-05 ENCOUNTER — Other Ambulatory Visit: Payer: Self-pay

## 2020-04-05 ENCOUNTER — Other Ambulatory Visit: Payer: Self-pay | Admitting: Family Medicine

## 2020-04-05 ENCOUNTER — Ambulatory Visit: Payer: BC Managed Care – PPO | Admitting: Family Medicine

## 2020-04-05 ENCOUNTER — Encounter: Payer: Self-pay | Admitting: Family Medicine

## 2020-04-05 VITALS — BP 122/84 | HR 74 | Temp 98.8°F | Resp 16 | Ht 63.0 in | Wt 185.2 lb

## 2020-04-05 DIAGNOSIS — I10 Essential (primary) hypertension: Secondary | ICD-10-CM

## 2020-04-05 DIAGNOSIS — A6004 Herpesviral vulvovaginitis: Secondary | ICD-10-CM

## 2020-04-05 MED ORDER — METOPROLOL SUCCINATE ER 100 MG PO TB24: 100.0000 mg | ORAL_TABLET | Freq: Every day | ORAL | 3 refills | Status: DC

## 2020-04-05 MED FILL — valACYclovir HCL 1 GM TABS: 1 | 30 days supply | Qty: 30 | Fill #0

## 2020-04-05 MED FILL — FETZIMA ER 80 MG CAPSULE: 80 | 30 days supply | Qty: 30 | Fill #1

## 2020-04-05 MED FILL — buPROPion HCL ER (XL) 300 M: 300 | 30 days supply | Qty: 30 | Fill #1

## 2020-04-05 MED FILL — METOPROLOL SUCC ER 100 MG T: 100 | 30 days supply | Qty: 30 | Fill #0

## 2020-04-05 NOTE — Patient Instructions (Signed)

## 2020-04-05 NOTE — Assessment & Plan Note (Signed)
Poorly controlled will alter medications, encouraged DASH diet, minimize caffeine and obtain adequate sleep. Report concerning symptoms and follow up as directed and as needed 

## 2020-04-05 NOTE — Progress Notes (Signed)
Subjective:    Patient ID: Rona Ravens, female    DOB: 1975-06-15, 45 y.o.   MRN: [id number]  Chief Complaint  Patient presents with  . Hypertension    Pt states blood pressures at home and states 162/96, 177/88, 166/92  . Follow-up    HPI Patient is in today for f/u bp   Her bp has been running high at home   No other complaints   Past Medical History:  Diagnosis Date  . Allergy   . Asthma   . Constipation   . Depression   . Frequent headaches   . GERD (gastroesophageal reflux disease)   . High cholesterol   . Kidney problem   . Lactose intolerance   . Migraine   . Vitamin D deficiency     Past Surgical History:  Procedure Laterality Date  . WISDOM TOOTH EXTRACTION      Family History  Problem Relation Age of Onset  . Alcohol abuse Mother   . Drug abuse Mother   . High Cholesterol Mother   . High blood pressure Father   . High Cholesterol Father   . Depression Father   . Breast cancer Maternal Grandmother   . Hypertension Maternal Grandmother   . Breast cancer Paternal Grandmother   . Hypertension Paternal Grandmother   . Hypertension Maternal Grandfather   . Hypertension Paternal Grandfather     Social History   Socioeconomic History  . Marital status: Married    Spouse name: Dorian Heckle  . Number of children: Not on file  . Years of education: Not on file  . Highest education level: Not on file  Occupational History  . 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 Smoking status: Never Smoker  . Smokeless tobacco: Never Used  Substance and Sexual Activity  . Alcohol use: Yes    Alcohol/week: 0.0 standard drinks    Comment: Occ  . Drug use: Never  . Sexual activity: Not on file  Other Topics Concern  . Not on file  Social History Narrative  . Not on file   Social Determinants of Health   Financial Resource Strain: Not on file  Food Insecurity: Not on file  Transportation Needs: Not  on file  Physical Activity: Not on file  Stress: Not on file  Social Connections: Not on file  Intimate Partner Violence: Not on file    Outpatient Medications Prior to Visit  Medication Sig Dispense Refill  . Biotin 1000 MCG tablet Take 1,000 mcg by mouth 3 (three) times daily.    Marland Kitchen buPROPion (WELLBUTRIN XL) 300 MG 24 hr tablet TAKE 1 TABLET (300 MG TOTAL) BY MOUTH DAILY. *NEED APPT* 30 tablet 1  . cholecalciferol (VITAMIN D) 1000 UNITS tablet Take 1,000 Units by mouth daily.    Marland Kitchen FETZIMA 80 MG CP24 TAKE 1 CAPSULE BY MOUTH DAILY. 30 capsule 1  . metFORMIN (GLUCOPHAGE) 500 MG tablet Take 1 tablet (500 mg total) by mouth daily with breakfast. 30 tablet 2  . vitamin B-12 (CYANOCOBALAMIN) 500 MCG tablet Take 500 mcg by mouth daily.    . Vitamin D, Ergocalciferol, (DRISDOL) 1.25 MG (50000 UNIT) CAPS capsule Take 1 capsule (50,000 Units total) by mouth every 7 (seven) days. 12 capsule 1  . metoprolol succinate (TOPROL-XL) 50 MG 24 hr tablet Take 1 tablet (50 mg total) by mouth daily. Take with or immediately following a meal. 30 tablet 1  .  valACYclovir (VALTREX) 1000 MG tablet Take 1 tablet (1,000 mg total) by mouth daily. 30 tablet 11   No facility-administered medications prior to visit.    Allergies  Allergen Reactions  . Ibuprofen     Elevated kidney function    Review of Systems  Constitutional: Negative for chills, fever and malaise/fatigue.  HENT: Negative for congestion and hearing loss.   Eyes: Negative for discharge.  Respiratory: Negative for cough, sputum production and shortness of breath.   Cardiovascular: Negative for chest pain, palpitations and leg swelling.  Gastrointestinal: Negative for abdominal pain, blood in stool, constipation, diarrhea, heartburn, nausea and vomiting.  Genitourinary: Negative for dysuria, frequency, hematuria and urgency.  Musculoskeletal: Negative for back pain, falls and myalgias.  Skin: Negative for rash.  Neurological: Negative for  dizziness, sensory change, loss of consciousness, weakness and headaches.  Endo/Heme/Allergies: Negative for environmental allergies. Does not bruise/bleed easily.  Psychiatric/Behavioral: Negative for depression and suicidal ideas. The patient is not nervous/anxious and does not have insomnia.        Objective:    Physical Exam Vitals and nursing note reviewed.  Constitutional:      Appearance: She is well-developed.  HENT:     Head: Normocephalic and atraumatic.  Eyes:     Conjunctiva/sclera: Conjunctivae normal.  Neck:     Thyroid: No thyromegaly.     Vascular: No carotid bruit or JVD.  Cardiovascular:     Rate and Rhythm: Normal rate and regular rhythm.     Heart sounds: Normal heart sounds. No murmur heard.   Pulmonary:     Effort: Pulmonary effort is normal. No respiratory distress.     Breath sounds: Normal breath sounds. No wheezing or rales.  Chest:     Chest wall: No tenderness.  Musculoskeletal:     Cervical back: Normal range of motion and neck supple.  Neurological:     Mental Status: She is alert and oriented to person, place, and time.     BP 122/84 (BP Location: Right Arm, Patient Position: Sitting, Cuff Size: Normal)   Pulse 74   Temp 98.8 F (37.1 C) (Oral)   Resp 16   Ht 5\' 3"  (1.6 m)   Wt 185 lb 3.2 oz (84 kg)   SpO2 100%   BMI 32.81 kg/m  Wt Readings from Last 3 Encounters:  04/05/20 185 lb 3.2 oz (84 kg)  03/22/20 185 lb 6.4 oz (84.1 kg)  06/18/19 185 lb (83.9 kg)    Diabetic Foot Exam - Simple   No data filed    Lab Results  Component Value Date   WBC 4.5 03/22/2020   HGB 14.2 03/22/2020   HCT 41.9 03/22/2020   PLT 283.0 03/22/2020   GLUCOSE 97 03/22/2020   CHOL 249 (H) 03/22/2020   TRIG 111.0 03/22/2020   HDL 63.90 03/22/2020   LDLCALC 163 (H) 03/22/2020   ALT 14 03/22/2020   AST 15 03/22/2020   NA 138 03/22/2020   K 3.8 03/22/2020   CL 104 03/22/2020   CREATININE 1.19 03/22/2020   BUN 15 03/22/2020   CO2 25 03/22/2020    TSH 0.94 03/22/2020   HGBA1C 5.7 (H) 02/18/2018    Lab Results  Component Value Date   TSH 0.94 03/22/2020   Lab Results  Component Value Date   WBC 4.5 03/22/2020   HGB 14.2 03/22/2020   HCT 41.9 03/22/2020   MCV 89.0 03/22/2020   PLT 283.0 03/22/2020   Lab Results  Component Value Date  NA 138 03/22/2020   K 3.8 03/22/2020   CO2 25 03/22/2020   GLUCOSE 97 03/22/2020   BUN 15 03/22/2020   CREATININE 1.19 03/22/2020   BILITOT 0.8 03/22/2020   ALKPHOS 73 03/22/2020   AST 15 03/22/2020   ALT 14 03/22/2020   PROT 7.2 03/22/2020   ALBUMIN 4.1 03/22/2020   CALCIUM 9.4 03/22/2020   GFR 55.49 (L) 03/22/2020   Lab Results  Component Value Date   CHOL 249 (H) 03/22/2020   Lab Results  Component Value Date   HDL 63.90 03/22/2020   Lab Results  Component Value Date   LDLCALC 163 (H) 03/22/2020   Lab Results  Component Value Date   TRIG 111.0 03/22/2020   Lab Results  Component Value Date   CHOLHDL 4 03/22/2020   Lab Results  Component Value Date   HGBA1C 5.7 (H) 02/18/2018       Assessment & Plan:   Problem List Items Addressed This Visit      Unprioritized   Essential hypertension    Poorly controlled will alter medications, encouraged DASH diet, minimize caffeine and obtain adequate sleep. Report concerning symptoms and follow up as directed and as needed      Relevant Medications   metoprolol succinate (TOPROL-XL) 100 MG 24 hr tablet    Other Visit Diagnoses    Primary hypertension    -  Primary   Relevant Medications   metoprolol succinate (TOPROL-XL) 100 MG 24 hr tablet      I have discontinued Maximina Cutrona's metoprolol succinate. I am also having her start on metoprolol succinate. Additionally, I am having her maintain her cholecalciferol, Biotin, vitamin B-12, buPROPion, Fetzima, Vitamin D (Ergocalciferol), and metFORMIN.  Meds ordered this encounter  Medications  . metoprolol succinate (TOPROL-XL) 100 MG 24 hr tablet    Sig: Take 1  tablet (100 mg total) by mouth daily. Take with or immediately following a meal.    Dispense:  30 tablet    Refill:  3     Donato Schultz, DO

## 2020-05-02 ENCOUNTER — Other Ambulatory Visit (HOSPITAL_BASED_OUTPATIENT_CLINIC_OR_DEPARTMENT_OTHER): Payer: Self-pay

## 2020-05-02 MED FILL — Metformin HCl Tab 500 MG: ORAL | 30 days supply | Qty: 30 | Fill #0 | Status: AC

## 2020-05-06 ENCOUNTER — Other Ambulatory Visit: Payer: Self-pay

## 2020-05-06 ENCOUNTER — Encounter: Payer: Self-pay | Admitting: Family

## 2020-05-06 ENCOUNTER — Other Ambulatory Visit (HOSPITAL_BASED_OUTPATIENT_CLINIC_OR_DEPARTMENT_OTHER): Payer: Self-pay

## 2020-05-06 ENCOUNTER — Ambulatory Visit: Payer: BC Managed Care – PPO | Admitting: Family

## 2020-05-06 VITALS — BP 149/94 | HR 71 | Temp 98.6°F | Resp 16 | Ht 62.5 in | Wt 183.0 lb

## 2020-05-06 DIAGNOSIS — Z1159 Encounter for screening for other viral diseases: Secondary | ICD-10-CM

## 2020-05-06 DIAGNOSIS — E785 Hyperlipidemia, unspecified: Secondary | ICD-10-CM

## 2020-05-06 DIAGNOSIS — I10 Essential (primary) hypertension: Secondary | ICD-10-CM

## 2020-05-06 DIAGNOSIS — R739 Hyperglycemia, unspecified: Secondary | ICD-10-CM | POA: Diagnosis not present

## 2020-05-06 DIAGNOSIS — R7303 Prediabetes: Secondary | ICD-10-CM | POA: Diagnosis not present

## 2020-05-06 DIAGNOSIS — Z0000 Encounter for general adult medical examination without abnormal findings: Secondary | ICD-10-CM | POA: Diagnosis not present

## 2020-05-06 LAB — HEMOGLOBIN A1C: Hgb A1c MFr Bld: 5.7 % (ref 4.6–6.5)

## 2020-05-06 LAB — LIPID PANEL
Cholesterol: 244 mg/dL — ABNORMAL HIGH (ref 0–200)
HDL: 61.1 mg/dL (ref >39.00)
LDL Cholesterol: 163 mg/dL — ABNORMAL HIGH (ref 0–99)
NonHDL: 182.43
Total CHOL/HDL Ratio: 4
Triglycerides: 96 mg/dL (ref 0.0–149.0)
VLDL: 19.2 mg/dL (ref 0.0–40.0)

## 2020-05-06 LAB — COMPREHENSIVE METABOLIC PANEL
ALT: 12 U/L (ref 0–35)
AST: 15 U/L (ref 0–37)
Albumin: 3.8 g/dL (ref 3.5–5.2)
Alkaline Phosphatase: 65 U/L (ref 39–117)
BUN: 10 mg/dL (ref 6–23)
CO2: 28 mEq/L (ref 19–32)
Calcium: 9 mg/dL (ref 8.4–10.5)
Chloride: 103 mEq/L (ref 96–112)
Creatinine, Ser: 1.31 mg/dL — ABNORMAL HIGH (ref 0.40–1.20)
GFR: 49.41 mL/min — ABNORMAL LOW (ref >60.00)
Glucose, Bld: 92 mg/dL (ref 70–99)
Potassium: 3.9 mEq/L (ref 3.5–5.1)
Sodium: 137 mEq/L (ref 135–145)
Total Bilirubin: 0.9 mg/dL (ref 0.2–1.2)
Total Protein: 7.3 g/dL (ref 6.0–8.3)

## 2020-05-06 MED ORDER — AMLODIPINE BESYLATE 5 MG PO TABS
5.0000 mg | ORAL_TABLET | Freq: Every day | ORAL | 3 refills | Status: DC
  Filled 2020-05-06: qty 30, 30d supply, fill #0

## 2020-05-06 NOTE — Assessment & Plan Note (Signed)
Obtain a1c.  

## 2020-05-06 NOTE — Patient Instructions (Addendum)
Please get your covid booster shot.  Please complete lab work prior to leaving. Work on [employer], exercise and weight loss.

## 2020-05-06 NOTE — Assessment & Plan Note (Signed)
Repeat manual bp check 154/95. Recommended that she add toprol xl 100mg  once daily and add amlodipine 5mg  once daily.

## 2020-05-06 NOTE — Progress Notes (Signed)
Established Patient Office Visit  Subjective:  Patient ID: Crystal Sandoval, female    DOB: [birth year]  Age: 45 y.o. MRN: [id number]  CC:  Chief Complaint  Patient presents with  . Annual Exam    HPI Crystal Sandoval presents for cpx.  Immunizations:  J and J x 1 only, Tdap 2019  Diet: diet needs improvement Exercise: not regualar Colonoscopy: due next month Pap Smear: 11/2018 Mammogram:  due  Feeling much better following covid infection.   HTN- pt had covid 19 about 3 weeks ago. She restart toprol xl 1 week ago (was off while she had covid).  BP Readings from Last 3 Encounters:  05/06/20 (!) 149/94  04/05/20 122/84  03/22/20 (!) 150/100     Past Medical History:  Diagnosis Date  . Allergy   . Asthma   . Constipation   . Depression   . Frequent headaches   . GERD (gastroesophageal reflux disease)   . High cholesterol   . Kidney problem   . Lactose intolerance   . Migraine   . Vitamin D deficiency     Past Surgical History:  Procedure Laterality Date  . WISDOM TOOTH EXTRACTION      Family History  Problem Relation Age of Onset  . Alcohol abuse Mother   . Drug abuse Mother   . High Cholesterol Mother   . High blood pressure Father   . High Cholesterol Father   . Depression Father   . Breast cancer Maternal Grandmother   . Hypertension Maternal Grandmother   . Breast cancer Paternal Grandmother   . Hypertension Paternal Grandmother   . Hypertension Maternal Grandfather   . Hypertension Paternal Grandfather     Social History   Socioeconomic History  . Marital status: Married    Spouse name: Dorian Heckle  . Number of children: Not on file  . Years of education: Not on file  . Highest education level: Not on file  Occupational History  . 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 Smoking status: Never Smoker  . Smokeless tobacco: Never Used  Substance and Sexual Activity  . Alcohol use: Yes     Alcohol/week: 0.0 standard drinks    Comment: Occ  . Drug use: Never  . Sexual activity: Not on file  Other Topics Concern  . Not on file  Social History Narrative  . Not on file   Social Determinants of Health   Financial Resource Strain: Not on file  Food Insecurity: Not on file  Transportation Needs: Not on file  Physical Activity: Not on file  Stress: Not on file  Social Connections: Not on file  Intimate Partner Violence: Not on file    Outpatient Medications Prior to Visit  Medication Sig Dispense Refill  . Biotin 1000 MCG tablet Take 1,000 mcg by mouth 3 (three) times daily.    Marland Kitchen buPROPion (WELLBUTRIN XL) 300 MG 24 hr tablet TAKE 1 TABLET (300 MG TOTAL) BY MOUTH DAILY. 30 tablet 1  . cholecalciferol (VITAMIN D) 1000 UNITS tablet Take 1,000 Units by mouth daily.    Marland Kitchen FETZIMA 80 MG CP24 TAKE 1 CAPSULE BY MOUTH DAILY. 30 capsule 1  . metFORMIN (GLUCOPHAGE) 500 MG tablet TAKE 1 TABLET BY MOUTH ONCE DAILY WITH BREAKFAST 30 tablet 2  . metoprolol succinate (TOPROL-XL) 100 MG 24 hr tablet TAKE 1 TABLET (100 MG TOTAL) BY MOUTH DAILY. TAKE WITH OR IMMEDIATELY FOLLOWING A MEAL. (  Patient taking differently: Take by mouth daily. Take with or immediately following a meal.) 30 tablet 3  . valACYclovir (VALTREX) 1000 MG tablet TAKE 1 TABLET (1,000 MG TOTAL) BY MOUTH DAILY. 30 tablet 11  . vitamin B-12 (CYANOCOBALAMIN) 500 MCG tablet Take 500 mcg by mouth daily.    . Vitamin D, Ergocalciferol, (DRISDOL) 1.25 MG (50000 UNIT) CAPS capsule TAKE 1 CAPSULE BY MOUTH EVERY 7 DAYS 12 capsule 1   No facility-administered medications prior to visit.    Allergies  Allergen Reactions  . Ibuprofen     Elevated kidney function    ROS Review of Systems  Constitutional: Negative for unexpected weight change.  HENT: Negative for hearing loss and rhinorrhea.   Eyes: Negative for visual disturbance.  Respiratory: Negative for cough and shortness of breath.   Cardiovascular: Negative for chest  pain.  Gastrointestinal: Negative for constipation and diarrhea.  Genitourinary: Negative for dysuria and frequency.  Musculoskeletal: Negative for arthralgias and myalgias.  Skin: Negative for rash.  Neurological: Negative for headaches.  Hematological: Negative for adenopathy.  Psychiatric/Behavioral:       Denies current concern about anxiety or depression       Objective:    Physical Exam  Pulse 71   Temp 98.6 F (37 C) (Oral)   Resp 16   Ht 5' 2.5" (1.588 m)   Wt 183 lb (83 kg)   SpO2 100%   BMI 32.94 kg/m  Wt Readings from Last 3 Encounters:  05/06/20 183 lb (83 kg)  04/05/20 185 lb 3.2 oz (84 kg)  03/22/20 185 lb 6.4 oz (84.1 kg)   Physical Exam  Constitutional: She is oriented to person, place, and time. She appears well-developed and well-nourished. No distress.  HENT:  Head: Normocephalic and atraumatic.  Right Ear: Tympanic membrane and ear canal normal.  Left Ear: Tympanic membrane and ear canal normal.  Mouth/Throat: Oropharynx is clear and moist.  Eyes: Pupils are equal, round, and reactive to light. No scleral icterus.  Neck: Normal range of motion. No thyromegaly present.  Cardiovascular: Normal rate and regular rhythm.   No murmur heard. Pulmonary/Chest: Effort normal and breath sounds normal. No respiratory distress. He has no wheezes. She has no rales. She exhibits no tenderness.  Abdominal: Soft. Bowel sounds are normal. She exhibits no distension and no mass. There is no tenderness. There is no rebound and no guarding.  Musculoskeletal: She exhibits no edema.  Lymphadenopathy:    She has no cervical adenopathy.  Neurological: She is alert and oriented to person, place, and time. She has normal patellar reflexes. She exhibits normal muscle tone. Coordination normal.  Skin: Skin is warm and dry.  Psychiatric: She has a normal mood and affect. Her behavior is normal. Judgment and thought content normal.  Breasts: Examined lying Right: Without  masses, retractions, discharge or axillary adenopathy.  Left: Without masses, retractions, discharge or axillary adenopathy.  Inguinal/mons: Normal without inguinal adenopathy  External genitalia: Normal  BUS/Urethra/Skene's glands: Normal  Bladder: Normal  Vagina: Normal  Cervix: Normal  Uterus: normal in size, shape and contour. Midline and mobile  Adnexa/parametria:  Rt: Without masses or tenderness.  Lt: Without masses or tenderness.  Anus and perineum: Normal            Assessment & Plan:     Health Maintenance Due  Topic Date Due  . Hepatitis C Screening  Never done  . MAMMOGRAM  12/05/2017  . COVID-19 Vaccine (2 - Booster for [employer] series) 11/04/2019  There are no preventive care reminders to display for this patient.  Lab Results  Component Value Date   TSH 0.94 03/22/2020   Lab Results  Component Value Date   WBC 4.5 03/22/2020   HGB 14.2 03/22/2020   HCT 41.9 03/22/2020   MCV 89.0 03/22/2020   PLT 283.0 03/22/2020   Lab Results  Component Value Date   NA 138 03/22/2020   K 3.8 03/22/2020   CO2 25 03/22/2020   GLUCOSE 97 03/22/2020   BUN 15 03/22/2020   CREATININE 1.19 03/22/2020   BILITOT 0.8 03/22/2020   ALKPHOS 73 03/22/2020   AST 15 03/22/2020   ALT 14 03/22/2020   PROT 7.2 03/22/2020   ALBUMIN 4.1 03/22/2020   CALCIUM 9.4 03/22/2020   GFR 55.49 (L) 03/22/2020   Lab Results  Component Value Date   CHOL 249 (H) 03/22/2020   Lab Results  Component Value Date   HDL 63.90 03/22/2020   Lab Results  Component Value Date   LDLCALC 163 (H) 03/22/2020   Lab Results  Component Value Date   TRIG 111.0 03/22/2020   Lab Results  Component Value Date   CHOLHDL 4 03/22/2020   Lab Results  Component Value Date   HGBA1C 5.7 (H) 02/18/2018      Assessment & Plan:   Problem List Items Addressed This Visit   None     No orders of the defined types were placed in this encounter.   Follow-up: No follow-ups on file.     Lemont Fillers, NP

## 2020-05-06 NOTE — Assessment & Plan Note (Signed)
Discussed healthy diet, exercise and weight loss.  Will need colo after her birthday. Order placed. Refer for mammogram. Form filled for school. She states she has all of her titers that she will get from her employer and attach to the form. Pap is up to date.

## 2020-05-09 LAB — HEPATITIS C ANTIBODY
Hepatitis C Ab: NONREACTIVE
SIGNAL TO CUT-OFF: 0.01 (ref <1.00)

## 2020-05-10 ENCOUNTER — Encounter: Payer: Self-pay | Admitting: Family

## 2020-05-12 ENCOUNTER — Other Ambulatory Visit: Payer: Self-pay

## 2020-05-12 ENCOUNTER — Ambulatory Visit: Payer: BC Managed Care – PPO

## 2020-05-12 DIAGNOSIS — I10 Essential (primary) hypertension: Secondary | ICD-10-CM

## 2020-05-12 NOTE — Progress Notes (Signed)
Patient here today for BP check.   BP Readings from Last 3 Encounters:  05/06/20 (!) 149/94  04/05/20 122/84  03/22/20 (!) 150/100   Was advised 4/29 to continue Toprol 100mg  and add Amlodipine 5 mg.  Bp today 124/ 90 Pulse  60 bpm1 Per Dr. , continue dame medications and keep appointment 6-30. Also check bp at home and if there is any changes to let Zola Button know.

## 2020-05-23 ENCOUNTER — Other Ambulatory Visit (HOSPITAL_BASED_OUTPATIENT_CLINIC_OR_DEPARTMENT_OTHER): Payer: Self-pay

## 2020-05-23 ENCOUNTER — Other Ambulatory Visit: Payer: Self-pay | Admitting: Family Medicine

## 2020-05-23 DIAGNOSIS — F32A Depression, unspecified: Secondary | ICD-10-CM

## 2020-05-23 DIAGNOSIS — F320 Major depressive disorder, single episode, mild: Secondary | ICD-10-CM

## 2020-05-23 MED ORDER — BUPROPION HCL ER (XL) 300 MG PO TB24
300.0000 mg | ORAL_TABLET | Freq: Every day | ORAL | 1 refills | Status: DC
  Filled 2020-05-23: qty 90, 90d supply, fill #0
  Filled 2020-12-15 – 2020-12-19 (×2): qty 90, 90d supply, fill #1

## 2020-05-23 MED ORDER — FETZIMA 80 MG PO CP24
1.0000 | ORAL_CAPSULE | Freq: Every day | ORAL | 1 refills | Status: DC
  Filled 2020-05-23: qty 90, 90d supply, fill #0
  Filled 2020-10-06: qty 60, 60d supply, fill #1
  Filled 2021-01-08: qty 60, 60d supply, fill #2
  Filled 2021-01-13: qty 30, 30d supply, fill #2

## 2020-05-23 MED FILL — Metoprolol Succinate Tab ER 24HR 100 MG (Tartrate Equiv): ORAL | 30 days supply | Qty: 30 | Fill #0 | Status: AC

## 2020-05-24 ENCOUNTER — Other Ambulatory Visit (HOSPITAL_BASED_OUTPATIENT_CLINIC_OR_DEPARTMENT_OTHER): Payer: Self-pay

## 2020-06-09 ENCOUNTER — Other Ambulatory Visit (HOSPITAL_BASED_OUTPATIENT_CLINIC_OR_DEPARTMENT_OTHER): Payer: Self-pay

## 2020-06-09 MED FILL — Metformin HCl Tab 500 MG: ORAL | 30 days supply | Qty: 30 | Fill #1 | Status: AC

## 2020-07-07 ENCOUNTER — Other Ambulatory Visit: Payer: Self-pay

## 2020-07-07 ENCOUNTER — Encounter: Payer: BC Managed Care – PPO | Admitting: Family Medicine

## 2020-07-08 ENCOUNTER — Other Ambulatory Visit (HOSPITAL_BASED_OUTPATIENT_CLINIC_OR_DEPARTMENT_OTHER): Payer: Self-pay

## 2020-07-08 ENCOUNTER — Encounter: Payer: Self-pay | Admitting: Family Medicine

## 2020-07-08 ENCOUNTER — Ambulatory Visit (INDEPENDENT_AMBULATORY_CARE_PROVIDER_SITE_OTHER): Payer: BC Managed Care – PPO | Admitting: Family Medicine

## 2020-07-08 VITALS — BP 122/86 | HR 60 | Temp 99.1°F | Resp 18 | Ht 62.5 in | Wt 181.6 lb

## 2020-07-08 DIAGNOSIS — Z111 Encounter for screening for respiratory tuberculosis: Secondary | ICD-10-CM

## 2020-07-08 DIAGNOSIS — E66811 Obesity, class 1: Secondary | ICD-10-CM

## 2020-07-08 DIAGNOSIS — E669 Obesity, unspecified: Secondary | ICD-10-CM | POA: Diagnosis not present

## 2020-07-08 DIAGNOSIS — Z1211 Encounter for screening for malignant neoplasm of colon: Secondary | ICD-10-CM

## 2020-07-08 DIAGNOSIS — E782 Mixed hyperlipidemia: Secondary | ICD-10-CM

## 2020-07-08 DIAGNOSIS — F329 Major depressive disorder, single episode, unspecified: Secondary | ICD-10-CM

## 2020-07-08 DIAGNOSIS — I10 Essential (primary) hypertension: Secondary | ICD-10-CM | POA: Diagnosis not present

## 2020-07-08 MED FILL — Metoprolol Succinate Tab ER 24HR 100 MG (Tartrate Equiv): ORAL | 30 days supply | Qty: 30 | Fill #1 | Status: AC

## 2020-07-08 NOTE — Patient Instructions (Signed)
https://www.nhlbi.nih.gov/files/docs/public/heart/dash_brief.pdf">  DASH Eating Plan DASH stands for Dietary Approaches to Stop Hypertension. The DASH eating plan is a healthy eating plan that has been shown to: Reduce high blood pressure (hypertension). Reduce your risk for type 2 diabetes, heart disease, and stroke. Help with weight loss. What are tips for following this plan? Reading food labels Check food labels for the amount of salt (sodium) per serving. Choose foods with less than 5 percent of the Daily Value of sodium. Generally, foods with less than 300 milligrams (mg) of sodium per serving fit into this eating plan. To find whole grains, look for the word "whole" as the first word in the ingredient list. Shopping Buy products labeled as "low-sodium" or "no salt added." Buy fresh foods. Avoid canned foods and pre-made or frozen meals. Cooking Avoid adding salt when cooking. Use salt-free seasonings or herbs instead of table salt or sea salt. Check with your health care provider or pharmacist before using salt substitutes. Do not fry foods. Cook foods using healthy methods such as baking, boiling, grilling, roasting, and broiling instead. Cook with heart-healthy oils, such as olive, canola, avocado, soybean, or sunflower oil. Meal planning  Eat a balanced diet that includes: 4 or more servings of fruits and 4 or more servings of vegetables each day. Try to fill one-half of your plate with fruits and vegetables. 6-8 servings of whole grains each day. Less than 6 oz (170 g) of lean meat, poultry, or fish each day. A 3-oz (85-g) serving of meat is about the same size as a deck of cards. One egg equals 1 oz (28 g). 2-3 servings of low-fat dairy each day. One serving is 1 cup (237 mL). 1 serving of nuts, seeds, or beans 5 times each week. 2-3 servings of heart-healthy fats. Healthy fats called omega-3 fatty acids are found in foods such as walnuts, flaxseeds, fortified milks, and eggs.  These fats are also found in cold-water fish, such as sardines, salmon, and mackerel. Limit how much you eat of: Canned or prepackaged foods. Food that is high in trans fat, such as some fried foods. Food that is high in saturated fat, such as fatty meat. Desserts and other sweets, sugary drinks, and other foods with added sugar. Full-fat dairy products. Do not salt foods before eating. Do not eat more than 4 egg yolks a week. Try to eat at least 2 vegetarian meals a week. Eat more home-cooked food and less restaurant, buffet, and fast food.  Lifestyle When eating at a restaurant, ask that your food be prepared with less salt or no salt, if possible. If you drink alcohol: Limit how much you use to: 0-1 drink a day for women who are not pregnant. 0-2 drinks a day for men. Be aware of how much alcohol is in your drink. In the [place] one drink equals one 12 oz bottle of beer (355 mL), one 5 oz glass of wine (148 mL), or one 1 oz glass of hard liquor (44 mL). General information Avoid eating more than 2,300 mg of salt a day. If you have hypertension, you may need to reduce your sodium intake to 1,500 mg a day. Work with your health care provider to maintain a healthy body weight or to lose weight. Ask what an ideal weight is for you. Get at least 30 minutes of exercise that causes your heart to beat faster (aerobic exercise) most days of the week. Activities may include walking, swimming, or biking. Work with your health care provider   or dietitian to adjust your eating plan to your individual calorie needs. What foods should I eat? Fruits All fresh, dried, or frozen fruit. Canned fruit in natural juice (without addedsugar). Vegetables Fresh or frozen vegetables (raw, steamed, roasted, or grilled). Low-sodium or reduced-sodium tomato and vegetable juice. Low-sodium or reduced-sodium tomatosauce and tomato paste. Low-sodium or reduced-sodium canned vegetables. Grains Whole-grain or  whole-wheat bread. Whole-grain or whole-wheat pasta. Brown rice. Oatmeal. Quinoa. Bulgur. Whole-grain and low-sodium cereals. Pita bread.Low-fat, low-sodium crackers. Whole-wheat flour tortillas. Meats and other proteins Skinless chicken or turkey. Ground chicken or turkey. Pork with fat trimmed off. Fish and seafood. Egg whites. Dried beans, peas, or lentils. Unsalted nuts, nut butters, and seeds. Unsalted canned beans. Lean cuts of beef with fat trimmed off. Low-sodium, lean precooked or cured meat, such as sausages or meatloaves. Dairy Low-fat (1%) or fat-free (skim) milk. Reduced-fat, low-fat, or fat-free cheeses. Nonfat, low-sodium ricotta or cottage cheese. Low-fat or nonfatyogurt. Low-fat, low-sodium cheese. Fats and oils Soft margarine without trans fats. Vegetable oil. Reduced-fat, low-fat, or light mayonnaise and salad dressings (reduced-sodium). Canola, safflower, olive, avocado, soybean, andsunflower oils. Avocado. Seasonings and condiments Herbs. Spices. Seasoning mixes without salt. Other foods Unsalted popcorn and pretzels. Fat-free sweets. The items listed above may not be a complete list of foods and beverages you can eat. Contact a dietitian for more information. What foods should I avoid? Fruits Canned fruit in a light or heavy syrup. Fried fruit. Fruit in cream or buttersauce. Vegetables Creamed or fried vegetables. Vegetables in a cheese sauce. Regular canned vegetables (not low-sodium or reduced-sodium). Regular canned tomato sauce and paste (not low-sodium or reduced-sodium). Regular tomato and vegetable juice(not low-sodium or reduced-sodium). Pickles. Olives. Grains Baked goods made with fat, such as croissants, muffins, or some breads. Drypasta or rice meal packs. Meats and other proteins Fatty cuts of meat. Ribs. Fried meat. Bacon. Bologna, salami, and other precooked or cured meats, such as sausages or meat loaves. Fat from the back of a pig (fatback). Bratwurst.  Salted nuts and seeds. Canned beans with added salt. Canned orsmoked fish. Whole eggs or egg yolks. Chicken or turkey with skin. Dairy Whole or 2% milk, cream, and half-and-half. Whole or full-fat cream cheese. Whole-fat or sweetened yogurt. Full-fat cheese. Nondairy creamers. Whippedtoppings. Processed cheese and cheese spreads. Fats and oils Butter. Stick margarine. Lard. Shortening. Ghee. Bacon fat. Tropical oils, suchas coconut, palm kernel, or palm oil. Seasonings and condiments Onion salt, garlic salt, seasoned salt, table salt, and sea salt. Worcestershire sauce. Tartar sauce. Barbecue sauce. Teriyaki sauce. Soy sauce, including reduced-sodium. Steak sauce. Canned and packaged gravies. Fish sauce. Oyster sauce. Cocktail sauce. Store-bought horseradish. Ketchup. Mustard. Meat flavorings and tenderizers. Bouillon cubes. Hot sauces. Pre-made or packaged marinades. Pre-made or packaged taco seasonings. Relishes. Regular saladdressings. Other foods Salted popcorn and pretzels. The items listed above may not be a complete list of foods and beverages you should avoid. Contact a dietitian for more information. Where to find more information National Heart, Lung, and Blood Institute: www.nhlbi.nih.gov American Heart Association: www.heart.org Academy of Nutrition and Dietetics: www.eatright.org National Kidney Foundation: www.kidney.org Summary The DASH eating plan is a healthy eating plan that has been shown to reduce high blood pressure (hypertension). It may also reduce your risk for type 2 diabetes, heart disease, and stroke. When on the DASH eating plan, aim to eat more fresh fruits and vegetables, whole grains, lean proteins, low-fat dairy, and heart-healthy fats. With the DASH eating plan, you should limit salt (sodium) intake to 2,300   mg a day. If you have hypertension, you may need to reduce your sodium intake to 1,500 mg a day. Work with your health care provider or dietitian to adjust  your eating plan to your individual calorie needs. This information is not intended to replace advice given to you by your health care provider. Make sure you discuss any questions you have with your healthcare provider. Document Revised: 11/28/2018 Document Reviewed: 11/28/2018 Elsevier Patient Education  2022 [employer]  

## 2020-07-08 NOTE — Progress Notes (Signed)
Patient ID: Crystal Sandoval, female    DOB: 23-Sep-1975  Age: 45 y.o. MRN: [id number]    Subjective:  Subjective  HPI Crystal Sandoval presents for an office visit and f/u on BP today. She endorses taking 100 mg TOPROL-XL PO Daily and 5 mg NORVASC PO Daily for her dx of HTN.  BP Readings from Last 3 Encounters:  07/08/20 122/86  05/06/20 (!) 149/94  04/05/20 122/84  She states that her major depressive disorder is well managed with 300 mg buPROPion PO Daily and 80 mg FETZIMA PO Daily.  She reports that she no longer attend health and wellness program since the program isn't working well with her. Pt wishes to check her cholesterol after meeting her weight goal.  Lab Results  Component Value Date   CHOL 244 (H) 05/06/2020   CHOL 249 (H) 03/22/2020   CHOL 259 (H) 11/24/2018   Lab Results  Component Value Date   HDL 61.10 05/06/2020   HDL 63.90 03/22/2020   HDL 64.90 11/24/2018   Lab Results  Component Value Date   LDLCALC 163 (H) 05/06/2020   LDLCALC 163 (H) 03/22/2020   LDLCALC 170 (H) 11/24/2018   Lab Results  Component Value Date   TRIG 96.0 05/06/2020   TRIG 111.0 03/22/2020   TRIG 120.0 11/24/2018   Lab Results  Component Value Date   CHOLHDL 4 05/06/2020   CHOLHDL 4 03/22/2020   CHOLHDL 4 11/24/2018   No results found for: LDLDIRECT  Wt Readings from Last 3 Encounters:  07/08/20 181 lb 9.6 oz (82.4 kg)  05/06/20 183 lb (83 kg)  04/05/20 185 lb 3.2 oz (84 kg)  She denies any chest pain, SOB, fever, abdominal pain, cough, chills, sore throat, dysuria, urinary incontinence, back pain, HA, or N/V/D at this time.   Review of Systems  Constitutional:  Negative for chills, fatigue and fever.  HENT:  Negative for ear pain, rhinorrhea, sinus pressure, sinus pain, sore throat and tinnitus.   Eyes:  Negative for pain.  Respiratory:  Negative for cough, shortness of breath and wheezing.   Cardiovascular:  Negative for chest pain.  Gastrointestinal:  Negative for abdominal pain,  anal bleeding, constipation, diarrhea, nausea and vomiting.  Genitourinary:  Negative for flank pain.  Musculoskeletal:  Negative for back pain and neck pain.  Skin:  Negative for rash.  Neurological:  Negative for seizures, weakness, light-headedness, numbness and headaches.   History Past Medical History:  Diagnosis Date   Allergy    Asthma    Constipation    Depression    Frequent headaches    GERD (gastroesophageal reflux disease)    High cholesterol    Hypertension    Kidney problem    Lactose intolerance    Migraine    Vitamin D deficiency     She has a past surgical history that includes Wisdom tooth extraction.   Her family history includes Alcohol abuse in her mother; Breast cancer in her maternal grandmother and paternal grandmother; Depression in her father; Drug abuse in her mother; High Cholesterol in her father and mother; High blood pressure in her father; Hypertension in her maternal grandfather, maternal grandmother, paternal grandfather, and paternal grandmother.She reports that she has never smoked. She has never used smokeless tobacco. She reports current alcohol use. She reports that she does not use drugs.  Current Outpatient Medications on File Prior to Visit  Medication Sig Dispense Refill   amLODipine (NORVASC) 5 MG tablet Take 1 tablet (5 mg total) by mouth  daily. 30 tablet 3   Biotin 1000 MCG tablet Take 1,000 mcg by mouth 3 (three) times daily.     buPROPion (WELLBUTRIN XL) 300 MG 24 hr tablet Take 1 tablet (300 mg total) by mouth daily. 90 tablet 1   cholecalciferol (VITAMIN D) 1000 UNITS tablet Take 1,000 Units by mouth daily.     FETZIMA 80 MG CP24 Take 1 capsule by mouth daily. 90 capsule 1   metFORMIN (GLUCOPHAGE) 500 MG tablet TAKE 1 TABLET BY MOUTH ONCE DAILY WITH BREAKFAST 30 tablet 2   metoprolol succinate (TOPROL-XL) 100 MG 24 hr tablet TAKE 1 TABLET (100 MG TOTAL) BY MOUTH DAILY. TAKE WITH OR IMMEDIATELY FOLLOWING A MEAL. 30 tablet 3    valACYclovir (VALTREX) 1000 MG tablet TAKE 1 TABLET (1,000 MG TOTAL) BY MOUTH DAILY. 30 tablet 11   vitamin B-12 (CYANOCOBALAMIN) 500 MCG tablet Take 500 mcg by mouth daily.     Vitamin D, Ergocalciferol, (DRISDOL) 1.25 MG (50000 UNIT) CAPS capsule TAKE 1 CAPSULE BY MOUTH EVERY 7 DAYS 12 capsule 1   No current facility-administered medications on file prior to visit.     Objective:  Objective  Physical Exam Vitals and nursing note reviewed.  Constitutional:      General: She is not in acute distress.    Appearance: Normal appearance. She is well-developed. She is not ill-appearing.  HENT:     Head: Normocephalic and atraumatic.     Right Ear: External ear normal.     Left Ear: External ear normal.     Nose: Nose normal.  Eyes:     General:        Right eye: No discharge.        Left eye: No discharge.     Extraocular Movements: Extraocular movements intact.     Pupils: Pupils are equal, round, and reactive to light.  Cardiovascular:     Rate and Rhythm: Normal rate and regular rhythm.     Pulses: Normal pulses.     Heart sounds: Normal heart sounds. No murmur heard.   No friction rub. No gallop.  Pulmonary:     Effort: Pulmonary effort is normal. No respiratory distress.     Breath sounds: Normal breath sounds. No stridor. No wheezing, rhonchi or rales.  Chest:     Chest wall: No tenderness.  Abdominal:     General: Bowel sounds are normal. There is no distension.     Palpations: Abdomen is soft. There is no mass.     Tenderness: There is no abdominal tenderness. There is no guarding or rebound.     Hernia: No hernia is present.  Musculoskeletal:        General: Normal range of motion.     Cervical back: Normal range of motion and neck supple.     Right lower leg: No edema.     Left lower leg: No edema.  Skin:    General: Skin is warm and dry.  Neurological:     Mental Status: She is alert and oriented to person, place, and time.  Psychiatric:        Behavior:  Behavior normal.        Thought Content: Thought content normal.   BP 122/86 (BP Location: Right Arm, Patient Position: Sitting, Cuff Size: Normal)   Pulse 60   Temp 99.1 F (37.3 C) (Oral)   Resp 18   Ht 5' 2.5" (1.588 m)   Wt 181 lb 9.6 oz (82.4 kg)   SpO2  98%   BMI 32.69 kg/m  Wt Readings from Last 3 Encounters:  07/08/20 181 lb 9.6 oz (82.4 kg)  05/06/20 183 lb (83 kg)  04/05/20 185 lb 3.2 oz (84 kg)     Lab Results  Component Value Date   WBC 4.5 03/22/2020   HGB 14.2 03/22/2020   HCT 41.9 03/22/2020   PLT 283.0 03/22/2020   GLUCOSE 92 05/06/2020   CHOL 244 (H) 05/06/2020   TRIG 96.0 05/06/2020   HDL 61.10 05/06/2020   LDLCALC 163 (H) 05/06/2020   ALT 12 05/06/2020   AST 15 05/06/2020   NA 137 05/06/2020   K 3.9 05/06/2020   CL 103 05/06/2020   CREATININE 1.31 (H) 05/06/2020   BUN 10 05/06/2020   CO2 28 05/06/2020   TSH 0.94 03/22/2020   HGBA1C 5.7 05/06/2020    DG Chest 2 View  Result Date: 10/10/2019 CLINICAL DATA:  Pt presents with shortness of breath with exertion; pt states she recovered from covid on 09/28 EXAM: CHEST - 2 VIEW COMPARISON:  None. FINDINGS: The cardiomediastinal contours are within normal limits. The lungs are clear. No pneumothorax or pleural effusion. No acute finding in the visualized skeleton. IMPRESSION: No acute cardiopulmonary finding. Electronically Signed   By: Emmaline Kluver M.D.   On: 10/10/2019 11:08     Assessment & Plan:  Plan   No orders of the defined types were placed in this encounter.   Problem List Items Addressed This Visit       Unprioritized   Essential hypertension    Well controlled, no changes to meds. Encouraged heart healthy diet such as the DASH diet and exercise as tolerated.        HLD (hyperlipidemia)    Encourage heart healthy diet such as MIND or DASH diet, increase exercise, avoid trans fats, simple carbohydrates and processed foods, consider a krill or fish or flaxseed oil cap daily.         Major depressive disorder    Stable  con't meds       Obesity (BMI 30.0-34.9)    con't diet and exercise       Other Visit Diagnoses     Colon cancer screening    -  Primary   Relevant Orders   Ambulatory referral to Gastroenterology   Screening-pulmonary TB       Relevant Orders   QuantiFERON-TB Gold Plus   Primary hypertension           Follow-up: Return in about 3 months (around 10/08/2020) for hypertension, hyperlipidemia.   I,Gordon Zheng,acting as a Neurosurgeon for [employer], DO.,have documented all relevant documentation on the behalf of Donato Schultz, DO,as directed by  Donato Schultz, DO while in the presence of Donato Schultz, DO.  I, Donato Schultz, DO, have reviewed all documentation for this visit. The documentation on 07/08/20 for the exam, diagnosis, procedures, and orders are all accurate and complete.

## 2020-07-08 NOTE — Assessment & Plan Note (Signed)
Well controlled, no changes to meds. Encouraged heart healthy diet such as the DASH diet and exercise as tolerated.  °

## 2020-07-08 NOTE — Assessment & Plan Note (Signed)
Stable con't meds 

## 2020-07-08 NOTE — Assessment & Plan Note (Signed)
Encourage heart healthy diet such as MIND or DASH diet, increase exercise, avoid trans fats, simple carbohydrates and processed foods, consider a krill or fish or flaxseed oil cap daily.  °

## 2020-07-08 NOTE — Assessment & Plan Note (Signed)
con't diet and exercise  

## 2020-07-10 LAB — QUANTIFERON-TB GOLD PLUS
Mitogen-NIL: >10 IU/mL
NIL: 0.04 IU/mL
QuantiFERON-TB Gold Plus: NEGATIVE
TB1-NIL: <0 IU/mL
TB2-NIL: <0 IU/mL

## 2020-07-14 ENCOUNTER — Other Ambulatory Visit (HOSPITAL_BASED_OUTPATIENT_CLINIC_OR_DEPARTMENT_OTHER): Payer: Self-pay

## 2020-07-14 ENCOUNTER — Other Ambulatory Visit: Payer: Self-pay | Admitting: Family Medicine

## 2020-07-14 DIAGNOSIS — E161 Other hypoglycemia: Secondary | ICD-10-CM

## 2020-07-14 MED ORDER — METFORMIN HCL 500 MG PO TABS
ORAL_TABLET | Freq: Every day | ORAL | 2 refills | Status: DC
  Filled 2020-07-14: qty 30, 30d supply, fill #0
  Filled 2020-07-21 – 2020-08-25 (×2): qty 30, 30d supply, fill #1
  Filled 2020-10-19: qty 30, 30d supply, fill #2

## 2020-07-21 ENCOUNTER — Other Ambulatory Visit (HOSPITAL_BASED_OUTPATIENT_CLINIC_OR_DEPARTMENT_OTHER): Payer: Self-pay

## 2020-08-05 ENCOUNTER — Telehealth: Payer: BC Managed Care – PPO | Admitting: Family

## 2020-08-05 DIAGNOSIS — R399 Unspecified symptoms and signs involving the genitourinary system: Secondary | ICD-10-CM | POA: Diagnosis not present

## 2020-08-05 MED ORDER — CEPHALEXIN 500 MG PO CAPS: 500.0000 mg | ORAL_CAPSULE | Freq: Two times a day (BID) | ORAL | 0 refills | Status: DC

## 2020-08-05 NOTE — Progress Notes (Signed)

## 2020-08-25 ENCOUNTER — Other Ambulatory Visit (HOSPITAL_BASED_OUTPATIENT_CLINIC_OR_DEPARTMENT_OTHER): Payer: Self-pay

## 2020-08-25 MED FILL — Metoprolol Succinate Tab ER 24HR 100 MG (Tartrate Equiv): ORAL | 30 days supply | Qty: 30 | Fill #2 | Status: AC

## 2020-09-28 ENCOUNTER — Other Ambulatory Visit (HOSPITAL_BASED_OUTPATIENT_CLINIC_OR_DEPARTMENT_OTHER): Payer: Self-pay

## 2020-09-28 DIAGNOSIS — L814 Other melanin hyperpigmentation: Secondary | ICD-10-CM | POA: Diagnosis not present

## 2020-09-28 DIAGNOSIS — Z411 Encounter for cosmetic surgery: Secondary | ICD-10-CM | POA: Diagnosis not present

## 2020-09-28 DIAGNOSIS — L821 Other seborrheic keratosis: Secondary | ICD-10-CM | POA: Diagnosis not present

## 2020-09-28 MED ORDER — TRETINOIN 0.1 % EX CREA
TOPICAL_CREAM | CUTANEOUS | 3 refills | Status: AC
  Filled 2020-09-28: qty 45, 30d supply, fill #0
  Filled 2020-09-28: qty 20, 30d supply, fill #0
  Filled 2021-03-17: qty 20, 30d supply, fill #1
  Filled 2021-09-08: qty 20, 30d supply, fill #2

## 2020-09-29 ENCOUNTER — Other Ambulatory Visit (HOSPITAL_BASED_OUTPATIENT_CLINIC_OR_DEPARTMENT_OTHER): Payer: Self-pay

## 2020-10-06 ENCOUNTER — Other Ambulatory Visit (HOSPITAL_BASED_OUTPATIENT_CLINIC_OR_DEPARTMENT_OTHER): Payer: Self-pay

## 2020-10-06 ENCOUNTER — Other Ambulatory Visit: Payer: Self-pay | Admitting: Family Medicine

## 2020-10-06 DIAGNOSIS — I10 Essential (primary) hypertension: Secondary | ICD-10-CM

## 2020-10-06 MED ORDER — METOPROLOL SUCCINATE ER 100 MG PO TB24
ORAL_TABLET | Freq: Every day | ORAL | 5 refills | Status: DC
  Filled 2020-10-06: qty 30, 30d supply, fill #0
  Filled 2020-11-22: qty 30, 30d supply, fill #1
  Filled 2021-01-08 – 2021-01-13 (×2): qty 30, 30d supply, fill #2
  Filled 2021-03-17: qty 30, 30d supply, fill #3
  Filled 2021-05-09: qty 30, 30d supply, fill #4
  Filled 2021-06-26: qty 30, 30d supply, fill #5

## 2020-10-10 ENCOUNTER — Ambulatory Visit: Payer: BC Managed Care – PPO | Admitting: Family Medicine

## 2020-10-10 ENCOUNTER — Other Ambulatory Visit: Payer: Self-pay

## 2020-10-10 ENCOUNTER — Other Ambulatory Visit (HOSPITAL_BASED_OUTPATIENT_CLINIC_OR_DEPARTMENT_OTHER): Payer: Self-pay

## 2020-10-10 ENCOUNTER — Encounter: Payer: Self-pay | Admitting: Family Medicine

## 2020-10-10 VITALS — BP 142/90 | HR 61 | Temp 98.5°F | Resp 18 | Ht 62.5 in | Wt 187.6 lb

## 2020-10-10 DIAGNOSIS — E785 Hyperlipidemia, unspecified: Secondary | ICD-10-CM | POA: Diagnosis not present

## 2020-10-10 DIAGNOSIS — I10 Essential (primary) hypertension: Secondary | ICD-10-CM

## 2020-10-10 MED ORDER — SAXENDA 18 MG/3ML ~~LOC~~ SOPN
0.6000 mg | PEN_INJECTOR | Freq: Every day | SUBCUTANEOUS | 2 refills | Status: DC
  Filled 2020-10-10 – 2020-11-23 (×3): qty 3, 30d supply, fill #0

## 2020-10-10 MED ORDER — HYDROCHLOROTHIAZIDE 25 MG PO TABS
25.0000 mg | ORAL_TABLET | Freq: Every day | ORAL | 3 refills | Status: DC
  Filled 2020-10-10: qty 90, 90d supply, fill #0
  Filled 2021-01-08 – 2021-01-13 (×2): qty 90, 90d supply, fill #1

## 2020-10-10 NOTE — Progress Notes (Signed)
Established Patient Office Visit  Subjective:  Patient ID: Crystal Sandoval, female    DOB: 1975-05-03  Age: 45 y.o. MRN: [id number]  CC:  Chief Complaint  Patient presents with   Hypertension   Hyperlipidemia   Follow-up    HPI Crystal Sandoval presents for bp recheck-----  she had run out of meds and only restarted it a week ago  Past Medical History:  Diagnosis Date   Allergy    Asthma    Constipation    Depression    Frequent headaches    GERD (gastroesophageal reflux disease)    High cholesterol    Hypertension    Kidney problem    Lactose intolerance    Migraine    Vitamin D deficiency     Past Surgical History:  Procedure Laterality Date   WISDOM TOOTH EXTRACTION      Family History  Problem Relation Age of Onset   Alcohol abuse Mother    Drug abuse Mother    High Cholesterol Mother    High blood pressure Father    High Cholesterol Father    Depression Father    Breast cancer Maternal Grandmother    Hypertension Maternal Grandmother    Breast cancer Paternal Grandmother    Hypertension Paternal Grandmother    Hypertension Maternal Grandfather    Hypertension Paternal Grandfather     Social History   Socioeconomic History   Marital status: Married    Spouse name: Dorian Heckle   Number of children: Not on file   Years of education: Not on file   Highest education level: Not on file  Occupational History   Occupation: Editor, commissioning    Employer: [employer]    Comment: surgical tech  Tobacco Use   Smoking status: Never   Smokeless tobacco: Never  Substance and Sexual Activity   Alcohol use: Yes    Alcohol/week: 0.0 standard drinks    Comment: Occ   Drug use: Never   Sexual activity: Yes  Other Topics Concern   Not on file  Social History Narrative   Not on file   Social Determinants of Health   Financial Resource Strain: Not on file  Food Insecurity: Not on file  Transportation Needs: Not on file  Physical Activity: Not on file   Stress: Not on file  Social Connections: Not on file  Intimate Partner Violence: Not on file    Outpatient Medications Prior to Visit  Medication Sig Dispense Refill   amLODipine (NORVASC) 5 MG tablet Take 1 tablet (5 mg total) by mouth daily. 30 tablet 3   Biotin 1000 MCG tablet Take 1,000 mcg by mouth 3 (three) times daily.     buPROPion (WELLBUTRIN XL) 300 MG 24 hr tablet Take 1 tablet (300 mg total) by mouth daily. 90 tablet 1   cholecalciferol (VITAMIN D) 1000 UNITS tablet Take 1,000 Units by mouth daily.     FETZIMA 80 MG CP24 Take 1 capsule by mouth daily. 90 capsule 1   metFORMIN (GLUCOPHAGE) 500 MG tablet Take 1 tablet by mouth daily with breakfast. 30 tablet 2   metoprolol succinate (TOPROL-XL) 100 MG 24 hr tablet TAKE 1 TABLET (100 MG TOTAL) BY MOUTH DAILY. TAKE WITH OR IMMEDIATELY FOLLOWING A MEAL. 30 tablet 5   tretinoin (RETIN-A) 0.1 % cream Apply 1 application to affected area in the evening to face externally for cosmetic use, 30 g 3   valACYclovir (VALTREX) 1000 MG tablet TAKE 1 TABLET (1,000 MG TOTAL) BY MOUTH  DAILY. 30 tablet 11   vitamin B-12 (CYANOCOBALAMIN) 500 MCG tablet Take 500 mcg by mouth daily.     Vitamin D, Ergocalciferol, (DRISDOL) 1.25 MG (50000 UNIT) CAPS capsule TAKE 1 CAPSULE BY MOUTH EVERY 7 DAYS 12 capsule 1   cephALEXin (KEFLEX) 500 MG capsule Take 1 capsule (500 mg total) by mouth 2 (two) times daily. (Patient not taking: Reported on 10/10/2020) 14 capsule 0   No facility-administered medications prior to visit.    Allergies  Allergen Reactions   Ibuprofen     Elevated kidney function    ROS Review of Systems  Constitutional:  Positive for fatigue. Negative for activity change, appetite change, diaphoresis and unexpected weight change.  Eyes:  Negative for pain, redness and visual disturbance.  Respiratory:  Negative for cough, chest tightness, shortness of breath and wheezing.   Cardiovascular:  Negative for chest pain, palpitations and leg  swelling.  Endocrine: Negative for cold intolerance, heat intolerance, polydipsia, polyphagia and polyuria.  Genitourinary:  Negative for difficulty urinating, dysuria and frequency.  Neurological:  Negative for dizziness, light-headedness, numbness and headaches.  Psychiatric/Behavioral:  Negative for behavioral problems and dysphoric mood. The patient is not nervous/anxious.      Objective:    Physical Exam Vitals and nursing note reviewed.  Constitutional:      Appearance: She is well-developed.  HENT:     Head: Normocephalic and atraumatic.  Eyes:     Conjunctiva/sclera: Conjunctivae normal.  Neck:     Thyroid: No thyromegaly.     Vascular: No carotid bruit or JVD.  Cardiovascular:     Rate and Rhythm: Normal rate and regular rhythm.     Heart sounds: Normal heart sounds. No murmur heard. Pulmonary:     Effort: Pulmonary effort is normal. No respiratory distress.     Breath sounds: Normal breath sounds. No wheezing or rales.  Chest:     Chest wall: No tenderness.  Musculoskeletal:     Cervical back: Normal range of motion and neck supple.  Neurological:     Mental Status: She is alert and oriented to person, place, and time.  Psychiatric:        Mood and Affect: Mood normal.        Behavior: Behavior normal.        Thought Content: Thought content normal.        Judgment: Judgment normal.    BP (!) 142/90 (BP Location: Left Arm, Patient Position: Sitting, Cuff Size: Large)   Pulse 61   Temp 98.5 F (36.9 C) (Oral)   Resp 18   Ht 5' 2.5" (1.588 m)   Wt 187 lb 9.6 oz (85.1 kg)   SpO2 98%   BMI 33.77 kg/m  Wt Readings from Last 3 Encounters:  10/10/20 187 lb 9.6 oz (85.1 kg)  07/08/20 181 lb 9.6 oz (82.4 kg)  05/06/20 183 lb (83 kg)     Health Maintenance Due  Topic Date Due   MAMMOGRAM  12/05/2017   COVID-19 Vaccine (2 - Booster for Janssen series) 11/04/2019   COLONOSCOPY (Pts 45-55yrs Insurance coverage will need to be confirmed)  Never done    There  are no preventive care reminders to display for this patient.  Lab Results  Component Value Date   TSH 0.94 03/22/2020   Lab Results  Component Value Date   WBC 4.5 03/22/2020   HGB 14.2 03/22/2020   HCT 41.9 03/22/2020   MCV 89.0 03/22/2020   PLT 283.0 03/22/2020   Lab  Results  Component Value Date   NA 137 05/06/2020   K 3.9 05/06/2020   CO2 28 05/06/2020   GLUCOSE 92 05/06/2020   BUN 10 05/06/2020   CREATININE 1.31 (H) 05/06/2020   BILITOT 0.9 05/06/2020   ALKPHOS 65 05/06/2020   AST 15 05/06/2020   ALT 12 05/06/2020   PROT 7.3 05/06/2020   ALBUMIN 3.8 05/06/2020   CALCIUM 9.0 05/06/2020   GFR 49.41 (L) 05/06/2020   Lab Results  Component Value Date   CHOL 244 (H) 05/06/2020   Lab Results  Component Value Date   HDL 61.10 05/06/2020   Lab Results  Component Value Date   LDLCALC 163 (H) 05/06/2020   Lab Results  Component Value Date   TRIG 96.0 05/06/2020   Lab Results  Component Value Date   CHOLHDL 4 05/06/2020   Lab Results  Component Value Date   HGBA1C 5.7 05/06/2020      Assessment & Plan:   Problem List Items Addressed This Visit       Unprioritized   HLD (hyperlipidemia)    Encourage heart healthy diet such as MIND or DASH diet, increase exercise, avoid trans fats, simple carbohydrates and processed foods, consider a krill or fish or flaxseed oil cap daily.       Relevant Medications   hydrochlorothiazide (HYDRODIURIL) 25 MG tablet   Other Relevant Orders   Lipid panel   Comprehensive metabolic panel   Morbid obesity (HCC)   Relevant Medications   Liraglutide -Weight Management (SAXENDA) 18 MG/3ML SOPN   Other Relevant Orders   Insulin, random   Primary hypertension - Primary    Poorly controlled will alter medications, encouraged DASH diet, minimize caffeine and obtain adequate sleep. Report concerning symptoms and follow up as directed and as needed      Relevant Medications   hydrochlorothiazide (HYDRODIURIL) 25 MG tablet    Other Relevant Orders   Lipid panel   Comprehensive metabolic panel    Meds ordered this encounter  Medications   hydrochlorothiazide (HYDRODIURIL) 25 MG tablet    Sig: Take 1 tablet (25 mg total) by mouth daily.    Dispense:  90 tablet    Refill:  3   Liraglutide -Weight Management (SAXENDA) 18 MG/3ML SOPN    Sig: Inject 0.6 mg into the skin daily.    Dispense:  3 mL    Refill:  2     Follow-up: Return in about 4 weeks (around 11/07/2020), or if symptoms worsen or fail to improve, for weight check and bp.    Donato Schultz, DO

## 2020-10-10 NOTE — Patient Instructions (Signed)

## 2020-10-10 NOTE — Assessment & Plan Note (Signed)
Poorly controlled will alter medications, encouraged DASH diet, minimize caffeine and obtain adequate sleep. Report concerning symptoms and follow up as directed and as needed 

## 2020-10-10 NOTE — Assessment & Plan Note (Signed)
Encourage heart healthy diet such as MIND or DASH diet, increase exercise, avoid trans fats, simple carbohydrates and processed foods, consider a krill or fish or flaxseed oil cap daily.  °

## 2020-10-11 ENCOUNTER — Ambulatory Visit: Payer: BC Managed Care – PPO | Admitting: Family Medicine

## 2020-10-11 LAB — COMPREHENSIVE METABOLIC PANEL
ALT: 13 U/L (ref 0–35)
AST: 16 U/L (ref 0–37)
Albumin: 4.2 g/dL (ref 3.5–5.2)
Alkaline Phosphatase: 63 U/L (ref 39–117)
BUN: 13 mg/dL (ref 6–23)
CO2: 25 mEq/L (ref 19–32)
Calcium: 9.5 mg/dL (ref 8.4–10.5)
Chloride: 101 mEq/L (ref 96–112)
Creatinine, Ser: 1.16 mg/dL (ref 0.40–1.20)
GFR: 57 mL/min — ABNORMAL LOW (ref >60.00)
Glucose, Bld: 88 mg/dL (ref 70–99)
Potassium: 4.2 mEq/L (ref 3.5–5.1)
Sodium: 135 mEq/L (ref 135–145)
Total Bilirubin: 0.6 mg/dL (ref 0.2–1.2)
Total Protein: 7.3 g/dL (ref 6.0–8.3)

## 2020-10-11 LAB — LIPID PANEL
Cholesterol: 240 mg/dL — ABNORMAL HIGH (ref 0–200)
HDL: 61.9 mg/dL (ref >39.00)
LDL Cholesterol: 143 mg/dL — ABNORMAL HIGH (ref 0–99)
NonHDL: 178.22
Total CHOL/HDL Ratio: 4
Triglycerides: 174 mg/dL — ABNORMAL HIGH (ref 0.0–149.0)
VLDL: 34.8 mg/dL (ref 0.0–40.0)

## 2020-10-11 LAB — INSULIN, RANDOM: Insulin: 14.3 u[IU]/mL

## 2020-10-12 ENCOUNTER — Other Ambulatory Visit: Payer: Self-pay | Admitting: Family Medicine

## 2020-10-12 DIAGNOSIS — E8881 Metabolic syndrome: Secondary | ICD-10-CM

## 2020-10-12 DIAGNOSIS — E785 Hyperlipidemia, unspecified: Secondary | ICD-10-CM

## 2020-10-19 ENCOUNTER — Other Ambulatory Visit (HOSPITAL_BASED_OUTPATIENT_CLINIC_OR_DEPARTMENT_OTHER): Payer: Self-pay

## 2020-10-19 ENCOUNTER — Other Ambulatory Visit: Payer: Self-pay | Admitting: Obstetrics and Gynecology

## 2020-10-19 DIAGNOSIS — N6320 Unspecified lump in the left breast, unspecified quadrant: Secondary | ICD-10-CM

## 2020-10-28 ENCOUNTER — Other Ambulatory Visit: Payer: Self-pay | Admitting: Family Medicine

## 2020-10-28 DIAGNOSIS — N6320 Unspecified lump in the left breast, unspecified quadrant: Secondary | ICD-10-CM

## 2020-11-07 ENCOUNTER — Other Ambulatory Visit: Payer: Self-pay

## 2020-11-07 ENCOUNTER — Encounter: Payer: Self-pay | Admitting: Family Medicine

## 2020-11-07 ENCOUNTER — Ambulatory Visit (INDEPENDENT_AMBULATORY_CARE_PROVIDER_SITE_OTHER): Payer: BC Managed Care – PPO | Admitting: Family Medicine

## 2020-11-07 DIAGNOSIS — I10 Essential (primary) hypertension: Secondary | ICD-10-CM

## 2020-11-07 NOTE — Progress Notes (Signed)
Subjective:   By signing my name below, I, Shehryar Baig, attest that this documentation has been prepared under the direction and in the presence of Dr. Seabron SpatesLowne-Chase, Shelba Susi, DO. 11/07/2020     Patient ID: Crystal Sandoval, female    DOB: July 24, 1975, 45 y.o.   MRN: [id number]  Chief Complaint  Patient presents with   Hypertension    Here for follow up    HPI Patient is in today for a office visit.   Blood sugar- She has not saxenda due to the increase in price and her insurance not paying for it. She continues taking 500 mg metformin daily PO and reports no new issues while taking it.   Lab Results  Component Value Date   HGBA1C 5.7 05/06/2020   Blood pressure- Her blood pressure is doing well during this visit. She continues taking 25 mg hydrochlorothiazide daily PO, 100 mg metoprolol succinate daily PO, 5 mg amlodipine daily PO and reports no new issues while taking it.   BP Readings from Last 3 Encounters:  11/07/20 134/74  10/10/20 (!) 142/90  07/08/20 122/86   Pulse Readings from Last 3 Encounters:  11/07/20 62  10/10/20 61  07/08/20 60   Weight loss- She has tried a healthy weight and wellness program in 2020 but has stopped after Covid-19 started. She reports losing some weight while attending session. She struggles to stay consistent with diet plans. She is busy throughout the day and has less time to prepare food.   Wt Readings from Last 3 Encounters:  11/07/20 188 lb (85.3 kg)  10/10/20 187 lb 9.6 oz (85.1 kg)  07/08/20 181 lb 9.6 oz (82.4 kg)   Immunizations- She is UTD on flu vaccines.  Colonoscopy- She has not made an appointment for a colonoscopy yet.    Past Medical History:  Diagnosis Date   Allergy    Asthma    Constipation    Depression    Frequent headaches    GERD (gastroesophageal reflux disease)    High cholesterol    Hypertension    Kidney problem    Lactose intolerance    Migraine    Vitamin D deficiency     Past Surgical History:   Procedure Laterality Date   WISDOM TOOTH EXTRACTION      Family History  Problem Relation Age of Onset   Alcohol abuse Mother    Drug abuse Mother    High Cholesterol Mother    High blood pressure Father    High Cholesterol Father    Depression Father    Breast cancer Maternal Grandmother    Hypertension Maternal Grandmother    Breast cancer Paternal Grandmother    Hypertension Paternal Grandmother    Hypertension Maternal Grandfather    Hypertension Paternal Grandfather     Social History   Socioeconomic History   Marital status: Married    Spouse name: Dorian Heckleony Burns   Number of children: Not on file   Years of education: Not on file   Highest education level: Not on file  Occupational History   Occupation: Editor, commissioningsurgerical tech    Employer: [employer]    Comment: surgical tech  Tobacco Use   Smoking status: Never   Smokeless tobacco: Never  Substance and Sexual Activity   Alcohol use: Yes    Alcohol/week: 0.0 standard drinks    Comment: Occ   Drug use: Never   Sexual activity: Yes  Other Topics Concern   Not on file  Social History Narrative  Not on file   Social Determinants of Health   Financial Resource Strain: Not on file  Food Insecurity: Not on file  Transportation Needs: Not on file  Physical Activity: Not on file  Stress: Not on file  Social Connections: Not on file  Intimate Partner Violence: Not on file    Outpatient Medications Prior to Visit  Medication Sig Dispense Refill   amLODipine (NORVASC) 5 MG tablet Take 1 tablet (5 mg total) by mouth daily. 30 tablet 3   Biotin 1000 MCG tablet Take 1,000 mcg by mouth 3 (three) times daily.     buPROPion (WELLBUTRIN XL) 300 MG 24 hr tablet Take 1 tablet (300 mg total) by mouth daily. 90 tablet 1   cholecalciferol (VITAMIN D) 1000 UNITS tablet Take 1,000 Units by mouth daily.     FETZIMA 80 MG CP24 Take 1 capsule by mouth daily. 90 capsule 1   hydrochlorothiazide (HYDRODIURIL) 25 MG tablet Take 1  tablet (25 mg total) by mouth daily. 90 tablet 3   Liraglutide -Weight Management (SAXENDA) 18 MG/3ML SOPN Inject 0.6 mg into the skin daily. 3 mL 2   metFORMIN (GLUCOPHAGE) 500 MG tablet Take 1 tablet by mouth daily with breakfast. 30 tablet 2   metoprolol succinate (TOPROL-XL) 100 MG 24 hr tablet TAKE 1 TABLET (100 MG TOTAL) BY MOUTH DAILY. TAKE WITH OR IMMEDIATELY FOLLOWING A MEAL. 30 tablet 5   tretinoin (RETIN-A) 0.1 % cream Apply 1 application to affected area in the evening to face externally for cosmetic use, 30 g 3   valACYclovir (VALTREX) 1000 MG tablet TAKE 1 TABLET (1,000 MG TOTAL) BY MOUTH DAILY. 30 tablet 11   vitamin B-12 (CYANOCOBALAMIN) 500 MCG tablet Take 500 mcg by mouth daily.     Vitamin D, Ergocalciferol, (DRISDOL) 1.25 MG (50000 UNIT) CAPS capsule TAKE 1 CAPSULE BY MOUTH EVERY 7 DAYS 12 capsule 1   No facility-administered medications prior to visit.    Allergies  Allergen Reactions   Ibuprofen     Elevated kidney function    Review of Systems  Constitutional:  Negative for chills, fever and malaise/fatigue.  HENT:  Negative for congestion and hearing loss.   Eyes:  Negative for blurred vision and discharge.  Respiratory:  Negative for cough, sputum production and shortness of breath.   Cardiovascular:  Negative for chest pain, palpitations and leg swelling.  Gastrointestinal:  Negative for abdominal pain, blood in stool, constipation, diarrhea, heartburn, nausea and vomiting.  Genitourinary:  Negative for dysuria, frequency, hematuria and urgency.  Musculoskeletal:  Negative for back pain, falls and myalgias.  Skin:  Negative for rash.  Neurological:  Negative for dizziness, sensory change, loss of consciousness, weakness and headaches.  Endo/Heme/Allergies:  Negative for environmental allergies. Does not bruise/bleed easily.  Psychiatric/Behavioral:  Negative for depression and suicidal ideas. The patient is not nervous/anxious and does not have insomnia.        Objective:    Physical Exam Vitals and nursing note reviewed.  Constitutional:      General: She is not in acute distress.    Appearance: Normal appearance. She is well-developed. She is not ill-appearing.  HENT:     Head: Normocephalic and atraumatic.     Right Ear: External ear normal.     Left Ear: External ear normal.  Eyes:     Extraocular Movements: Extraocular movements intact.     Conjunctiva/sclera: Conjunctivae normal.     Pupils: Pupils are equal, round, and reactive to light.  Neck:  Thyroid: No thyromegaly.     Vascular: No carotid bruit or JVD.  Cardiovascular:     Rate and Rhythm: Normal rate and regular rhythm.     Heart sounds: Normal heart sounds. No murmur heard.   No gallop.  Pulmonary:     Effort: Pulmonary effort is normal. No respiratory distress.     Breath sounds: Normal breath sounds. No wheezing or rales.  Chest:     Chest wall: No tenderness.  Musculoskeletal:     Cervical back: Normal range of motion and neck supple.  Skin:    General: Skin is warm and dry.  Neurological:     Mental Status: She is alert and oriented to person, place, and time.  Psychiatric:        Behavior: Behavior normal.        Judgment: Judgment normal.    BP 134/74 (BP Location: Left Arm, Patient Position: Sitting, Cuff Size: Large)   Pulse 62   Temp 98.4 F (36.9 C) (Oral)   Resp 16   Wt 188 lb (85.3 kg)   SpO2 100%   BMI 33.84 kg/m  Wt Readings from Last 3 Encounters:  11/07/20 188 lb (85.3 kg)  10/10/20 187 lb 9.6 oz (85.1 kg)  07/08/20 181 lb 9.6 oz (82.4 kg)    Diabetic Foot Exam - Simple   No data filed    Lab Results  Component Value Date   WBC 4.5 03/22/2020   HGB 14.2 03/22/2020   HCT 41.9 03/22/2020   PLT 283.0 03/22/2020   GLUCOSE 88 10/10/2020   CHOL 240 (H) 10/10/2020   TRIG 174.0 (H) 10/10/2020   HDL 61.90 10/10/2020   LDLCALC 143 (H) 10/10/2020   ALT 13 10/10/2020   AST 16 10/10/2020   NA 135 10/10/2020   K 4.2 10/10/2020    CL 101 10/10/2020   CREATININE 1.16 10/10/2020   BUN 13 10/10/2020   CO2 25 10/10/2020   TSH 0.94 03/22/2020   HGBA1C 5.7 05/06/2020    Lab Results  Component Value Date   TSH 0.94 03/22/2020   Lab Results  Component Value Date   WBC 4.5 03/22/2020   HGB 14.2 03/22/2020   HCT 41.9 03/22/2020   MCV 89.0 03/22/2020   PLT 283.0 03/22/2020   Lab Results  Component Value Date   NA 135 10/10/2020   K 4.2 10/10/2020   CO2 25 10/10/2020   GLUCOSE 88 10/10/2020   BUN 13 10/10/2020   CREATININE 1.16 10/10/2020   BILITOT 0.6 10/10/2020   ALKPHOS 63 10/10/2020   AST 16 10/10/2020   ALT 13 10/10/2020   PROT 7.3 10/10/2020   ALBUMIN 4.2 10/10/2020   CALCIUM 9.5 10/10/2020   GFR 57.00 (L) 10/10/2020   Lab Results  Component Value Date   CHOL 240 (H) 10/10/2020   Lab Results  Component Value Date   HDL 61.90 10/10/2020   Lab Results  Component Value Date   LDLCALC 143 (H) 10/10/2020   Lab Results  Component Value Date   TRIG 174.0 (H) 10/10/2020   Lab Results  Component Value Date   CHOLHDL 4 10/10/2020   Lab Results  Component Value Date   HGBA1C 5.7 05/06/2020       Assessment & Plan:   Problem List Items Addressed This Visit       Unprioritized   Primary hypertension    Well controlled, no changes to meds. Encouraged heart healthy diet such as the DASH diet and exercise as tolerated.  No orders of the defined types were placed in this encounter.   I, Dr. Seabron Spates, DO, personally preformed the services described in this documentation.  All medical record entries made by the scribe were at my direction and in my presence.  I have reviewed the chart and discharge instructions (if applicable) and agree that the record reflects my personal performance and is accurate and complete. 11/07/2020   I,Shehryar Baig,acting as a scribe for Donato Schultz, DO.,have documented all relevant documentation on the behalf of Donato Schultz, DO,as directed by  Donato Schultz, DO while in the presence of Donato Schultz, DO.   Donato Schultz, DO

## 2020-11-07 NOTE — Assessment & Plan Note (Signed)
Well controlled, no changes to meds. Encouraged heart healthy diet such as the DASH diet and exercise as tolerated.  °

## 2020-11-08 HISTORY — PX: BREAST BIOPSY: SHX20

## 2020-11-09 ENCOUNTER — Other Ambulatory Visit: Payer: Self-pay | Admitting: Family Medicine

## 2020-11-09 ENCOUNTER — Ambulatory Visit
Admission: RE | Admit: 2020-11-09 | Discharge: 2020-11-09 | Disposition: A | Discharge status: Home / Self Care | Payer: BC Managed Care – PPO | Source: Ambulatory Visit | Attending: Family Medicine | Admitting: Family Medicine

## 2020-11-09 ENCOUNTER — Other Ambulatory Visit: Payer: Self-pay

## 2020-11-09 ENCOUNTER — Ambulatory Visit
Admission: RE | Admit: 2020-11-09 | Discharge: 2020-11-09 | Disposition: A | Discharge status: Home / Self Care | Payer: BC Managed Care – PPO | Source: Ambulatory Visit | Attending: Obstetrics and Gynecology | Admitting: Obstetrics and Gynecology

## 2020-11-09 DIAGNOSIS — N6310 Unspecified lump in the right breast, unspecified quadrant: Secondary | ICD-10-CM

## 2020-11-09 DIAGNOSIS — R928 Other abnormal and inconclusive findings on diagnostic imaging of breast: Secondary | ICD-10-CM

## 2020-11-09 DIAGNOSIS — R922 Inconclusive mammogram: Secondary | ICD-10-CM | POA: Diagnosis not present

## 2020-11-09 DIAGNOSIS — N6311 Unspecified lump in the right breast, upper outer quadrant: Secondary | ICD-10-CM | POA: Diagnosis not present

## 2020-11-09 DIAGNOSIS — N6320 Unspecified lump in the left breast, unspecified quadrant: Secondary | ICD-10-CM

## 2020-11-18 ENCOUNTER — Ambulatory Visit
Admission: RE | Admit: 2020-11-18 | Discharge: 2020-11-18 | Disposition: A | Discharge status: Home / Self Care | Payer: BC Managed Care – PPO | Source: Ambulatory Visit | Attending: Family Medicine | Admitting: Family Medicine

## 2020-11-18 DIAGNOSIS — N6310 Unspecified lump in the right breast, unspecified quadrant: Secondary | ICD-10-CM

## 2020-11-18 DIAGNOSIS — N6311 Unspecified lump in the right breast, upper outer quadrant: Secondary | ICD-10-CM | POA: Diagnosis not present

## 2020-11-18 DIAGNOSIS — N6011 Diffuse cystic mastopathy of right breast: Secondary | ICD-10-CM | POA: Diagnosis not present

## 2020-11-23 ENCOUNTER — Other Ambulatory Visit (HOSPITAL_BASED_OUTPATIENT_CLINIC_OR_DEPARTMENT_OTHER): Payer: Self-pay

## 2020-12-15 ENCOUNTER — Other Ambulatory Visit: Payer: Self-pay | Admitting: Family Medicine

## 2020-12-15 ENCOUNTER — Other Ambulatory Visit: Payer: Self-pay

## 2020-12-15 DIAGNOSIS — E161 Other hypoglycemia: Secondary | ICD-10-CM

## 2020-12-15 MED ORDER — METFORMIN HCL 500 MG PO TABS
ORAL_TABLET | Freq: Every day | ORAL | 2 refills | Status: DC
  Filled 2020-12-15 – 2020-12-19 (×2): qty 30, 30d supply, fill #0
  Filled 2021-01-08: qty 30, 30d supply, fill #1
  Filled 2021-01-13: qty 30, 30d supply, fill #0
  Filled 2021-02-15: qty 30, 30d supply, fill #1

## 2020-12-19 ENCOUNTER — Other Ambulatory Visit: Payer: Self-pay

## 2020-12-19 ENCOUNTER — Other Ambulatory Visit (HOSPITAL_BASED_OUTPATIENT_CLINIC_OR_DEPARTMENT_OTHER): Payer: Self-pay

## 2021-01-09 ENCOUNTER — Other Ambulatory Visit: Payer: Self-pay

## 2021-01-13 ENCOUNTER — Other Ambulatory Visit (HOSPITAL_BASED_OUTPATIENT_CLINIC_OR_DEPARTMENT_OTHER): Payer: Self-pay

## 2021-01-18 ENCOUNTER — Other Ambulatory Visit (HOSPITAL_BASED_OUTPATIENT_CLINIC_OR_DEPARTMENT_OTHER): Payer: Self-pay

## 2021-01-19 ENCOUNTER — Other Ambulatory Visit (HOSPITAL_BASED_OUTPATIENT_CLINIC_OR_DEPARTMENT_OTHER): Payer: Self-pay

## 2021-02-15 ENCOUNTER — Other Ambulatory Visit (HOSPITAL_BASED_OUTPATIENT_CLINIC_OR_DEPARTMENT_OTHER): Payer: Self-pay

## 2021-02-17 ENCOUNTER — Ambulatory Visit
Admission: EM | Admit: 2021-02-17 | Discharge: 2021-02-17 | Disposition: A | Discharge status: Other Acute Inpatient Hospital | Payer: BC Managed Care – PPO

## 2021-02-17 ENCOUNTER — Other Ambulatory Visit: Payer: Self-pay

## 2021-03-17 ENCOUNTER — Other Ambulatory Visit: Payer: Self-pay | Admitting: Family Medicine

## 2021-03-17 DIAGNOSIS — E161 Other hypoglycemia: Secondary | ICD-10-CM

## 2021-03-17 DIAGNOSIS — F32A Depression, unspecified: Secondary | ICD-10-CM

## 2021-03-18 ENCOUNTER — Other Ambulatory Visit (HOSPITAL_BASED_OUTPATIENT_CLINIC_OR_DEPARTMENT_OTHER): Payer: Self-pay

## 2021-03-20 ENCOUNTER — Other Ambulatory Visit (HOSPITAL_BASED_OUTPATIENT_CLINIC_OR_DEPARTMENT_OTHER): Payer: Self-pay

## 2021-03-20 MED ORDER — METFORMIN HCL 500 MG PO TABS
ORAL_TABLET | Freq: Every day | ORAL | 2 refills | Status: DC
  Filled 2021-03-20: qty 30, 30d supply, fill #0
  Filled 2021-05-09: qty 30, 30d supply, fill #1
  Filled 2021-06-26: qty 30, 30d supply, fill #2

## 2021-03-20 MED ORDER — FETZIMA 80 MG PO CP24
1.0000 | ORAL_CAPSULE | Freq: Every day | ORAL | 1 refills | Status: DC
  Filled 2021-03-20: qty 90, 90d supply, fill #0
  Filled 2021-08-24: qty 90, 90d supply, fill #1

## 2021-03-21 ENCOUNTER — Other Ambulatory Visit (HOSPITAL_BASED_OUTPATIENT_CLINIC_OR_DEPARTMENT_OTHER): Payer: Self-pay

## 2021-04-09 ENCOUNTER — Telehealth: Payer: BC Managed Care – PPO | Admitting: Family

## 2021-04-09 DIAGNOSIS — R399 Unspecified symptoms and signs involving the genitourinary system: Secondary | ICD-10-CM | POA: Diagnosis not present

## 2021-04-09 MED ORDER — CEPHALEXIN 500 MG PO CAPS: 500.0000 mg | ORAL_CAPSULE | Freq: Two times a day (BID) | ORAL | 0 refills | Status: DC

## 2021-04-09 NOTE — Progress Notes (Signed)

## 2021-05-09 ENCOUNTER — Other Ambulatory Visit (HOSPITAL_BASED_OUTPATIENT_CLINIC_OR_DEPARTMENT_OTHER): Payer: Self-pay

## 2021-05-27 ENCOUNTER — Telehealth: Payer: BC Managed Care – PPO | Admitting: Nurse Practitioner

## 2021-05-27 DIAGNOSIS — R399 Unspecified symptoms and signs involving the genitourinary system: Secondary | ICD-10-CM

## 2021-05-27 MED ORDER — NITROFURANTOIN MONOHYD MACRO 100 MG PO CAPS: 100.0000 mg | ORAL_CAPSULE | Freq: Two times a day (BID) | ORAL | 0 refills | Status: AC

## 2021-05-27 NOTE — Progress Notes (Signed)
I have spent 5 minutes in review of e-visit questionnaire, review and updating patient chart, medical decision making and response to patient.  ° °Crystal Sebastiano W Caeley Dohrmann, NP ° °  °

## 2021-05-27 NOTE — Progress Notes (Signed)

## 2021-06-22 ENCOUNTER — Telehealth: Payer: Self-pay | Admitting: Family Medicine

## 2021-06-22 ENCOUNTER — Other Ambulatory Visit: Payer: Self-pay | Admitting: Family Medicine

## 2021-06-22 DIAGNOSIS — Z1159 Encounter for screening for other viral diseases: Secondary | ICD-10-CM

## 2021-06-22 DIAGNOSIS — Z111 Encounter for screening for respiratory tuberculosis: Secondary | ICD-10-CM

## 2021-06-22 NOTE — Telephone Encounter (Signed)
Appt scheduled

## 2021-06-22 NOTE — Telephone Encounter (Signed)
Pt called stating she needed a TB Blood test done for school. Orders needed to schedule Lab appt to be drawn.

## 2021-06-26 ENCOUNTER — Other Ambulatory Visit (INDEPENDENT_AMBULATORY_CARE_PROVIDER_SITE_OTHER): Payer: BC Managed Care – PPO

## 2021-06-26 ENCOUNTER — Other Ambulatory Visit (HOSPITAL_BASED_OUTPATIENT_CLINIC_OR_DEPARTMENT_OTHER): Payer: Self-pay

## 2021-06-26 ENCOUNTER — Ambulatory Visit: Payer: BC Managed Care – PPO | Admitting: Family Medicine

## 2021-06-26 DIAGNOSIS — E8881 Metabolic syndrome: Secondary | ICD-10-CM | POA: Diagnosis not present

## 2021-06-26 DIAGNOSIS — E785 Hyperlipidemia, unspecified: Secondary | ICD-10-CM

## 2021-06-26 DIAGNOSIS — Z111 Encounter for screening for respiratory tuberculosis: Secondary | ICD-10-CM | POA: Diagnosis not present

## 2021-06-27 LAB — LIPID PANEL
Cholesterol: 239 mg/dL — ABNORMAL HIGH (ref 0–200)
HDL: 56.3 mg/dL (ref >39.00)
LDL Cholesterol: 156 mg/dL — ABNORMAL HIGH (ref 0–99)
NonHDL: 182.82
Total CHOL/HDL Ratio: 4
Triglycerides: 136 mg/dL (ref 0.0–149.0)
VLDL: 27.2 mg/dL (ref 0.0–40.0)

## 2021-06-27 LAB — COMPREHENSIVE METABOLIC PANEL
ALT: 11 U/L (ref 0–35)
AST: 15 U/L (ref 0–37)
Albumin: 3.9 g/dL (ref 3.5–5.2)
Alkaline Phosphatase: 57 U/L (ref 39–117)
BUN: 12 mg/dL (ref 6–23)
CO2: 30 mEq/L (ref 19–32)
Calcium: 9.1 mg/dL (ref 8.4–10.5)
Chloride: 103 mEq/L (ref 96–112)
Creatinine, Ser: 1.31 mg/dL — ABNORMAL HIGH (ref 0.40–1.20)
GFR: 49.01 mL/min — ABNORMAL LOW (ref >60.00)
Glucose, Bld: 79 mg/dL (ref 70–99)
Potassium: 3.7 mEq/L (ref 3.5–5.1)
Sodium: 139 mEq/L (ref 135–145)
Total Bilirubin: 1.1 mg/dL (ref 0.2–1.2)
Total Protein: 6.8 g/dL (ref 6.0–8.3)

## 2021-06-27 LAB — INSULIN, RANDOM: Insulin: 68.2 u[IU]/mL — ABNORMAL HIGH

## 2021-06-27 LAB — HEMOGLOBIN A1C: Hgb A1c MFr Bld: 5.7 % (ref 4.6–6.5)

## 2021-06-29 ENCOUNTER — Other Ambulatory Visit: Payer: Self-pay | Admitting: Family Medicine

## 2021-06-29 DIAGNOSIS — E785 Hyperlipidemia, unspecified: Secondary | ICD-10-CM

## 2021-06-29 DIAGNOSIS — E8881 Metabolic syndrome: Secondary | ICD-10-CM

## 2021-06-30 ENCOUNTER — Other Ambulatory Visit (HOSPITAL_BASED_OUTPATIENT_CLINIC_OR_DEPARTMENT_OTHER): Payer: Self-pay

## 2021-06-30 ENCOUNTER — Other Ambulatory Visit: Payer: Self-pay

## 2021-06-30 LAB — QUANTIFERON-TB GOLD PLUS
Mitogen-NIL: 7.72 [IU]/mL
NIL: 0.01 [IU]/mL
QuantiFERON-TB Gold Plus: NEGATIVE
TB1-NIL: 0.01 [IU]/mL
TB2-NIL: 0.01 [IU]/mL

## 2021-06-30 MED ORDER — METFORMIN HCL 500 MG PO TABS
500.0000 mg | ORAL_TABLET | Freq: Two times a day (BID) | ORAL | 2 refills | Status: DC
Start: 2021-06-30 — End: 2022-06-19
  Filled 2021-06-30 – 2021-10-14 (×2): qty 60, 30d supply, fill #0
  Filled 2021-12-02: qty 60, 30d supply, fill #1
  Filled 2022-04-17: qty 60, 30d supply, fill #2

## 2021-07-01 ENCOUNTER — Telehealth: Payer: BC Managed Care – PPO | Admitting: Nurse Practitioner

## 2021-07-01 DIAGNOSIS — R399 Unspecified symptoms and signs involving the genitourinary system: Secondary | ICD-10-CM | POA: Diagnosis not present

## 2021-07-01 MED ORDER — NITROFURANTOIN MONOHYD MACRO 100 MG PO CAPS: 100.0000 mg | ORAL_CAPSULE | Freq: Two times a day (BID) | ORAL | 0 refills | Status: AC

## 2021-07-01 NOTE — Progress Notes (Signed)
E-Visit for Urinary Problems ? ?We are sorry that you are not feeling well.  Here is how we plan to help! ? ?Based on what you shared with me it looks like you most likely have a simple urinary tract infection. ? ?A UTI (Urinary Tract Infection) is a bacterial infection of the bladder. ? ?Most cases of urinary tract infections are simple to treat but a key part of your care is to encourage you to drink plenty of fluids and watch your symptoms carefully. ? ?I have prescribed MacroBid 100 mg twice a day for 5 days.  Your symptoms should gradually improve. Call us if the burning in your urine worsens, you develop worsening fever, back pain or pelvic pain or if your symptoms do not resolve after completing the antibiotic. ? ?Urinary tract infections can be prevented by drinking plenty of water to keep your body hydrated.  Also be sure when you wipe, wipe from front to back and don't hold it in!  If possible, empty your bladder every 4 hours. ? ?HOME CARE ?Drink plenty of fluids ?Compete the full course of the antibiotics even if the symptoms resolve ?Remember, when you need to go?go. Holding in your urine can increase the likelihood of getting a UTI! ?GET HELP RIGHT AWAY IF: ?You cannot urinate ?You get a high fever ?Worsening back pain occurs ?You see blood in your urine ?You feel sick to your stomach or throw up ?You feel like you are going to pass out ? ?MAKE SURE YOU  ?Understand these instructions. ?Will watch your condition. ?Will get help right away if you are not doing well or get worse. ? ? ?Thank you for choosing an e-visit. ? ?Your e-visit answers were reviewed by a board certified advanced clinical practitioner to complete your personal care plan. Depending upon the condition, your plan could have included both over the counter or prescription medications. ? ?Please review your pharmacy choice. Make sure the pharmacy is open so you can pick up prescription now. If there is a problem, you may contact your  provider through MyChart messaging and have the prescription routed to another pharmacy.  Your safety is important to us. If you have drug allergies check your prescription carefully.  ? ?For the next 24 hours you can use MyChart to ask questions about today's visit, request a non-urgent call back, or ask for a work or school excuse. ?You will get an email in the next two days asking about your experience. I hope that your e-visit has been valuable and will speed your recovery.  ?

## 2021-07-10 ENCOUNTER — Ambulatory Visit (INDEPENDENT_AMBULATORY_CARE_PROVIDER_SITE_OTHER): Payer: BC Managed Care – PPO | Admitting: Family Medicine

## 2021-07-10 ENCOUNTER — Encounter: Payer: BC Managed Care – PPO | Admitting: Family Medicine

## 2021-07-10 ENCOUNTER — Other Ambulatory Visit (HOSPITAL_BASED_OUTPATIENT_CLINIC_OR_DEPARTMENT_OTHER): Payer: Self-pay

## 2021-07-10 ENCOUNTER — Encounter: Payer: Self-pay | Admitting: Family Medicine

## 2021-07-10 VITALS — BP 138/80 | HR 82 | Temp 98.6°F | Resp 16 | Ht 62.0 in | Wt 157.0 lb

## 2021-07-10 DIAGNOSIS — F320 Major depressive disorder, single episode, mild: Secondary | ICD-10-CM

## 2021-07-10 DIAGNOSIS — Z1211 Encounter for screening for malignant neoplasm of colon: Secondary | ICD-10-CM

## 2021-07-10 DIAGNOSIS — R3129 Other microscopic hematuria: Secondary | ICD-10-CM | POA: Diagnosis not present

## 2021-07-10 DIAGNOSIS — Z0000 Encounter for general adult medical examination without abnormal findings: Secondary | ICD-10-CM | POA: Diagnosis not present

## 2021-07-10 DIAGNOSIS — E785 Hyperlipidemia, unspecified: Secondary | ICD-10-CM

## 2021-07-10 DIAGNOSIS — I10 Essential (primary) hypertension: Secondary | ICD-10-CM

## 2021-07-10 LAB — POC URINALSYSI DIPSTICK (AUTOMATED)
Glucose, UA: NEGATIVE
Ketones, UA: NEGATIVE
Leukocytes, UA: NEGATIVE
Nitrite, UA: NEGATIVE
Protein, UA: NEGATIVE
Spec Grav, UA: 1.015 (ref 1.010–1.025)
Urobilinogen, UA: 0.2 E.U./dL
pH, UA: 6 (ref 5.0–8.0)

## 2021-07-10 MED ORDER — BUPROPION HCL ER (XL) 300 MG PO TB24
300.0000 mg | ORAL_TABLET | Freq: Every day | ORAL | 3 refills | Status: DC
  Filled 2021-07-10: qty 90, 90d supply, fill #0
  Filled 2021-12-02: qty 90, 90d supply, fill #1
  Filled 2022-03-08: qty 90, 90d supply, fill #2

## 2021-07-10 MED ORDER — WEGOVY 0.25 MG/0.5ML ~~LOC~~ SOAJ
0.2500 mg | SUBCUTANEOUS | 2 refills | Status: DC
  Filled 2021-08-28 – 2021-08-31 (×2): qty 2, 28d supply, fill #0

## 2021-07-10 NOTE — Assessment & Plan Note (Signed)
Encourage heart healthy diet such as MIND or DASH diet, increase exercise, avoid trans fats, simple carbohydrates and processed foods, consider a krill or fish or flaxseed oil cap daily.  °

## 2021-07-10 NOTE — Progress Notes (Signed)
Subjective:   By signing my name below, I, Crystal Sandoval, attest that this documentation has been prepared under the direction and in the presence of Crystal Schultz DO 07/10/2021    Patient ID: Crystal Sandoval, female    DOB: 11-Feb-1975, 46 y.o.   MRN: [id number]  Chief Complaint  Patient presents with   Annual Exam    Here for Annual Exam     HPI Patient is in today for a comprehensive physical exam.   She is requesting a refill of 300 Mg of Wellbutrin XL.   She is currently taking 500 Mg of Metformin. She is tolerating the medication.  She reports that Crystal Sandoval was too expensive for her. She also reports that she has not tried Crystal Sandoval.  Lab Results  Component Value Date   HGBA1C 5.7 06/26/2021   She is not going to [facility] and reports that she has not been in awhile.   She reports that she has had 3 UTIs since 04/2021. She states that this is abnormal for her. She reports that the one's as of lately are after intercourse. As of not, she does not feel like she has an UTI and recently finished taking antibiotics.   She reports that she has dry skin, especially on her chest and scratches it at night.   She denies having any fever, new muscle pain, joint pain , new moles, congestion, sinus pain, sore throat, chest pain, palpations, cough, SOB ,wheezing,n/v/d constipation, blood in stool, dysuria, frequency, hematuria, at this time  She denies of any changes to her family medical history.  She is due for a colonoscopy. She does not have a gastrologist.  Pap Smear last completed on 11/24/2018 Mammogram last completed on 11/09/2020. She has been going to the breast center for her mammograms.  Immunizations: She has not received the Covid-19 boosters.  She is not UTD on dental exams She is UTD on vision exams.    Past Medical History:  Diagnosis Date   Allergy    Asthma    Constipation    Depression    Frequent headaches    GERD (gastroesophageal reflux  disease)    High cholesterol    Hypertension    Kidney problem    Lactose intolerance    Migraine    Vitamin D deficiency     Past Surgical History:  Procedure Laterality Date   WISDOM TOOTH EXTRACTION      Family History  Problem Relation Age of Onset   Alcohol abuse Mother    Drug abuse Mother    High Cholesterol Mother    High blood pressure Father    High Cholesterol Father    Depression Father    Breast cancer Maternal Grandmother    Hypertension Maternal Grandmother    Breast cancer Paternal Grandmother    Hypertension Paternal Grandmother    Hypertension Maternal Grandfather    Hypertension Paternal Grandfather     Social History   Socioeconomic History   Marital status: Married    Spouse name: Crystal Sandoval   Number of children: Not on file   Years of education: Not on file   Highest education level: Not on file  Occupational History   Occupation: Editor, commissioning    Employer: [employer]    Comment: surgical tech  Tobacco Use   Smoking status: Never   Smokeless tobacco: Never  Substance and Sexual Activity   Alcohol use: Yes    Alcohol/week: 0.0 standard drinks of  alcohol    Comment: Occ   Drug use: Never   Sexual activity: Yes  Other Topics Concern   Not on file  Social History Narrative   Not on file   Social Determinants of Health   Financial Resource Strain: Not on file  Food Insecurity: Not on file  Transportation Needs: Not on file  Physical Activity: Not on file  Stress: Not on file  Social Connections: Not on file  Intimate Partner Violence: Not on file    Outpatient Medications Prior to Visit  Medication Sig Dispense Refill   amLODipine (NORVASC) 5 MG tablet Take 1 tablet (5 mg total) by mouth daily. 30 tablet 3   Biotin 1000 MCG tablet Take 1,000 mcg by mouth 3 (three) times daily.     cephALEXin (KEFLEX) 500 MG capsule Take 1 capsule (500 mg total) by mouth 2 (two) times daily. 14 capsule 0   cholecalciferol (VITAMIN D) 1000  UNITS tablet Take 1,000 Units by mouth daily.     FETZIMA 80 MG CP24 Take 1 capsule by mouth daily. 90 capsule 1   hydrochlorothiazide (HYDRODIURIL) 25 MG tablet Take 1 tablet (25 mg total) by mouth daily. 90 tablet 3   metFORMIN (GLUCOPHAGE) 500 MG tablet Take 1 tablet (500 mg total) by mouth 2 (two) times daily with a meal. 60 tablet 2   metoprolol succinate (TOPROL-XL) 100 MG 24 hr tablet TAKE 1 TABLET (100 MG TOTAL) BY MOUTH DAILY. TAKE WITH OR IMMEDIATELY FOLLOWING A MEAL. 30 tablet 5   tretinoin (RETIN-A) 0.1 % cream Apply 1 application to affected area in the evening to face externally for cosmetic use, 30 g 3   vitamin B-12 (CYANOCOBALAMIN) 500 MCG tablet Take 500 mcg by mouth daily.     buPROPion (WELLBUTRIN XL) 300 MG 24 hr tablet Take 1 tablet (300 mg total) by mouth daily. 90 tablet 1   No facility-administered medications prior to visit.    Allergies  Allergen Reactions   Ibuprofen     Elevated kidney function    Review of Systems  Constitutional:  Negative for fever.  HENT:  Negative for congestion, sinus pain and sore throat.   Respiratory:  Negative for cough, shortness of breath and wheezing.   Cardiovascular:  Negative for chest pain and palpitations.  Gastrointestinal:  Negative for blood in stool, constipation, diarrhea, nausea and vomiting.  Genitourinary:  Negative for dysuria, frequency and hematuria.  Musculoskeletal:  Negative for joint pain and myalgias.  Skin:        (-) New Moles        Objective:    Physical Exam Constitutional:      General: She is not in acute distress.    Appearance: Normal appearance. She is not ill-appearing.  HENT:     Head: Normocephalic and atraumatic.     Right Ear: Tympanic membrane, ear canal and external ear normal.     Left Ear: Tympanic membrane, ear canal and external ear normal.  Eyes:     Extraocular Movements: Extraocular movements intact.     Pupils: Pupils are equal, round, and reactive to light.  Neck:      Thyroid: No thyromegaly.  Cardiovascular:     Rate and Rhythm: Normal rate and regular rhythm.     Heart sounds: Normal heart sounds. No murmur heard.    No gallop.  Pulmonary:     Effort: Pulmonary effort is normal. No respiratory distress.     Breath sounds: Normal breath sounds. No wheezing  or rales.  Abdominal:     General: Bowel sounds are normal. There is no distension.     Palpations: Abdomen is soft.     Tenderness: There is no abdominal tenderness. There is no guarding.  Lymphadenopathy:     Cervical: No cervical adenopathy.  Skin:    General: Skin is warm and dry.  Neurological:     Mental Status: She is alert and oriented to person, place, and time.  Psychiatric:        Judgment: Judgment normal.     BP 138/80 (BP Location: Right Arm, Patient Position: Sitting, Cuff Size: Normal)   Pulse 82   Temp 98.6 F (37 C) (Oral)   Resp 16   Ht 5\' 2"  (1.575 m)   Wt 157 lb (71.2 kg)   SpO2 99%   BMI 28.72 kg/m  Wt Readings from Last 3 Encounters:  07/10/21 157 lb (71.2 kg)  11/07/20 188 lb (85.3 kg)  10/10/20 187 lb 9.6 oz (85.1 kg)    Diabetic Foot Exam - Simple   No data filed    Lab Results  Component Value Date   WBC 4.5 03/22/2020   HGB 14.2 03/22/2020   HCT 41.9 03/22/2020   PLT 283.0 03/22/2020   GLUCOSE 79 06/26/2021   CHOL 239 (H) 06/26/2021   TRIG 136.0 06/26/2021   HDL 56.30 06/26/2021   LDLCALC 156 (H) 06/26/2021   ALT 11 06/26/2021   AST 15 06/26/2021   NA 139 06/26/2021   K 3.7 06/26/2021   CL 103 06/26/2021   CREATININE 1.31 (H) 06/26/2021   BUN 12 06/26/2021   CO2 30 06/26/2021   TSH 0.94 03/22/2020   HGBA1C 5.7 06/26/2021    Lab Results  Component Value Date   TSH 0.94 03/22/2020   Lab Results  Component Value Date   WBC 4.5 03/22/2020   HGB 14.2 03/22/2020   HCT 41.9 03/22/2020   MCV 89.0 03/22/2020   PLT 283.0 03/22/2020   Lab Results  Component Value Date   NA 139 06/26/2021   K 3.7 06/26/2021   CO2 30 06/26/2021    GLUCOSE 79 06/26/2021   BUN 12 06/26/2021   CREATININE 1.31 (H) 06/26/2021   BILITOT 1.1 06/26/2021   ALKPHOS 57 06/26/2021   AST 15 06/26/2021   ALT 11 06/26/2021   PROT 6.8 06/26/2021   ALBUMIN 3.9 06/26/2021   CALCIUM 9.1 06/26/2021   GFR 49.01 (L) 06/26/2021   Lab Results  Component Value Date   CHOL 239 (H) 06/26/2021   Lab Results  Component Value Date   HDL 56.30 06/26/2021   Lab Results  Component Value Date   LDLCALC 156 (H) 06/26/2021   Lab Results  Component Value Date   TRIG 136.0 06/26/2021   Lab Results  Component Value Date   CHOLHDL 4 06/26/2021   Lab Results  Component Value Date   HGBA1C 5.7 06/26/2021       Assessment & Plan:   Problem List Items Addressed This Visit       Unprioritized   Primary hypertension    Well controlled, no changes to meds. Encouraged heart healthy diet such as the DASH diet and exercise as tolerated.       Preventative health care - Primary    ghm utd Check labs  See avs       Relevant Orders   POCT Urinalysis Dipstick (Automated) (Completed)   HLD (hyperlipidemia)    Encourage heart healthy diet such as MIND  or DASH diet, increase exercise, avoid trans fats, simple carbohydrates and processed foods, consider a krill or fish or flaxseed oil cap daily.       Other Visit Diagnoses     Colon cancer screening       Relevant Orders   Ambulatory referral to Gastroenterology   Depression, major, single episode, mild (HCC)       Relevant Medications   buPROPion (WELLBUTRIN XL) 300 MG 24 hr tablet   Microscopic hematuria       Relevant Orders   Urine Culture        Meds ordered this encounter  Medications   Semaglutide-Weight Management (WEGOVY) 0.25 MG/0.5ML SOAJ    Sig: Inject 0.25 mg into the skin once a week.    Dispense:  2 mL    Refill:  2   buPROPion (WELLBUTRIN XL) 300 MG 24 hr tablet    Sig: Take 1 tablet (300 mg total) by mouth daily.    Dispense:  90 tablet    Refill:  3    I,  Crystal Schultz, DO, personally preformed the services described in this documentation.  All medical record entries made by the scribe were at my direction and in my presence.  I have reviewed the chart and discharge instructions (if applicable) and agree that the record reflects my personal performance and is accurate and complete. 07/10/2021   I,Amber Collins,acting as a scribe for Crystal Schultz, DO.,have documented all relevant documentation on the behalf of Crystal Schultz, DO,as directed by  Crystal Schultz, DO while in the presence of Crystal Schultz, DO.   Crystal Schultz, DO

## 2021-07-10 NOTE — Patient Instructions (Signed)
Preventive Care 46-46 Years Old, Female Preventive care refers to lifestyle choices and visits with your health care provider that can promote health and wellness. Preventive care visits are also called wellness exams. What can I expect for my preventive care visit? Counseling Your health care provider may ask you questions about your: Medical history, including: Past medical problems. Family medical history. Pregnancy history. Current health, including: Menstrual cycle. Method of birth control. Emotional well-being. Home life and relationship well-being. Sexual activity and sexual health. Lifestyle, including: Alcohol, nicotine or tobacco, and drug use. Access to firearms. Diet, exercise, and sleep habits. Work and work environment. Sunscreen use. Safety issues such as seatbelt and bike helmet use. Physical exam Your health care provider will check your: Height and weight. These may be used to calculate your BMI (body mass index). BMI is a measurement that tells if you are at a healthy weight. Waist circumference. This measures the distance around your waistline. This measurement also tells if you are at a healthy weight and may help predict your risk of certain diseases, such as type 2 diabetes and high blood pressure. Heart rate and blood pressure. Body temperature. Skin for abnormal spots. What immunizations do I need?  Vaccines are usually given at various ages, according to a schedule. Your health care provider will recommend vaccines for you based on your age, medical history, and lifestyle or other factors, such as travel or where you work. What tests do I need? Screening Your health care provider may recommend screening tests for certain conditions. This may include: Lipid and cholesterol levels. Diabetes screening. This is done by checking your blood sugar (glucose) after you have not eaten for a while (fasting). Pelvic exam and Pap test. Hepatitis B test. Hepatitis C  test. HIV (human immunodeficiency virus) test. STI (sexually transmitted infection) testing, if you are at risk. Lung cancer screening. Colorectal cancer screening. Mammogram. Talk with your health care provider about when you should start having regular mammograms. This may depend on whether you have a family history of breast cancer. BRCA-related cancer screening. This may be done if you have a family history of breast, ovarian, tubal, or peritoneal cancers. Bone density scan. This is done to screen for osteoporosis. Talk with your health care provider about your test results, treatment options, and if necessary, the need for more tests. Follow these instructions at home: Eating and drinking  Eat a diet that includes fresh fruits and vegetables, whole grains, lean protein, and low-fat dairy products. Take vitamin and mineral supplements as recommended by your health care provider. Do not drink alcohol if: Your health care provider tells you not to drink. You are pregnant, may be pregnant, or are planning to become pregnant. If you drink alcohol: Limit how much you have to 0-1 drink a day. Know how much alcohol is in your drink. In the [place] one drink equals one 12 oz bottle of beer (355 mL), one 5 oz glass of wine (148 mL), or one 1 oz glass of hard liquor (44 mL). Lifestyle Brush your teeth every morning and night with fluoride toothpaste. Floss one time each day. Exercise for at least 30 minutes 5 or more days each week. Do not use any products that contain nicotine or tobacco. These products include cigarettes, chewing tobacco, and vaping devices, such as e-cigarettes. If you need help quitting, ask your health care provider. Do not use drugs. If you are sexually active, practice safe sex. Use a condom or other form of protection to   prevent STIs. If you do not wish to become pregnant, use a form of birth control. If you plan to become pregnant, see your health care provider for a  prepregnancy visit. Take aspirin only as told by your health care provider. Make sure that you understand how much to take and what form to take. Work with your health care provider to find out whether it is safe and beneficial for you to take aspirin daily. Find healthy ways to manage stress, such as: Meditation, yoga, or listening to music. Journaling. Talking to a trusted person. Spending time with friends and family. Minimize exposure to UV radiation to reduce your risk of skin cancer. Safety Always wear your seat belt while driving or riding in a vehicle. Do not drive: If you have been drinking alcohol. Do not ride with someone who has been drinking. When you are tired or distracted. While texting. If you have been using any mind-altering substances or drugs. Wear a helmet and other protective equipment during sports activities. If you have firearms in your house, make sure you follow all gun safety procedures. Seek help if you have been physically or sexually abused. What's next? Visit your health care provider once a year for an annual wellness visit. Ask your health care provider how often you should have your eyes and teeth checked. Stay up to date on all vaccines. This information is not intended to replace advice given to you by your health care provider. Make sure you discuss any questions you have with your health care provider. Document Revised: 06/22/2020 Document Reviewed: 06/22/2020 Elsevier Patient Education  [place].

## 2021-07-10 NOTE — Assessment & Plan Note (Signed)
Well controlled, no changes to meds. Encouraged heart healthy diet such as the DASH diet and exercise as tolerated.  °

## 2021-07-10 NOTE — Assessment & Plan Note (Signed)
ghm utd Check labs  See avs  

## 2021-07-11 LAB — URINE CULTURE
MICRO NUMBER:: 13601224
Result:: NO GROWTH
SPECIMEN QUALITY:: ADEQUATE

## 2021-07-17 ENCOUNTER — Other Ambulatory Visit (HOSPITAL_BASED_OUTPATIENT_CLINIC_OR_DEPARTMENT_OTHER): Payer: Self-pay

## 2021-08-24 ENCOUNTER — Other Ambulatory Visit: Payer: Self-pay | Admitting: Family Medicine

## 2021-08-24 ENCOUNTER — Other Ambulatory Visit (HOSPITAL_BASED_OUTPATIENT_CLINIC_OR_DEPARTMENT_OTHER): Payer: Self-pay

## 2021-08-24 DIAGNOSIS — I10 Essential (primary) hypertension: Secondary | ICD-10-CM

## 2021-08-24 MED ORDER — METOPROLOL SUCCINATE ER 100 MG PO TB24
ORAL_TABLET | Freq: Every day | ORAL | 5 refills | Status: DC
  Filled 2021-08-24: qty 30, 30d supply, fill #0
  Filled 2021-10-14: qty 25, 25d supply, fill #1
  Filled 2021-10-16: qty 5, 5d supply, fill #1
  Filled 2021-12-02: qty 30, 30d supply, fill #2
  Filled 2022-02-06: qty 30, 30d supply, fill #3
  Filled 2022-03-08: qty 30, 30d supply, fill #4
  Filled 2022-04-17: qty 30, 30d supply, fill #5

## 2021-08-25 ENCOUNTER — Other Ambulatory Visit (HOSPITAL_BASED_OUTPATIENT_CLINIC_OR_DEPARTMENT_OTHER): Payer: Self-pay

## 2021-08-28 ENCOUNTER — Telehealth: Payer: Self-pay

## 2021-08-28 ENCOUNTER — Other Ambulatory Visit (HOSPITAL_BASED_OUTPATIENT_CLINIC_OR_DEPARTMENT_OTHER): Payer: Self-pay

## 2021-08-28 NOTE — Telephone Encounter (Signed)
Tessa with [employer] calling to let us know that pt needs a PA done for [facility].   PA Phone- [phone] for [employer]. Or it can be done with Cover my meds.

## 2021-08-28 NOTE — Telephone Encounter (Signed)
Patient called to advise that [facility] requires PA. Informed patient insurance already called in and it has been passed on to the doctor.

## 2021-08-30 NOTE — Telephone Encounter (Signed)
PA sent  Key: [id number]

## 2021-08-31 ENCOUNTER — Other Ambulatory Visit (HOSPITAL_BASED_OUTPATIENT_CLINIC_OR_DEPARTMENT_OTHER): Payer: Self-pay

## 2021-09-12 ENCOUNTER — Other Ambulatory Visit (HOSPITAL_BASED_OUTPATIENT_CLINIC_OR_DEPARTMENT_OTHER): Payer: Self-pay

## 2021-09-13 ENCOUNTER — Other Ambulatory Visit (HOSPITAL_BASED_OUTPATIENT_CLINIC_OR_DEPARTMENT_OTHER): Payer: Self-pay

## 2021-09-14 ENCOUNTER — Other Ambulatory Visit (HOSPITAL_BASED_OUTPATIENT_CLINIC_OR_DEPARTMENT_OTHER): Payer: Self-pay

## 2021-09-14 NOTE — Telephone Encounter (Signed)
Cover[employer]Meds called. PA denied.

## 2021-09-19 NOTE — Telephone Encounter (Signed)
PT called. LDVM

## 2021-10-10 ENCOUNTER — Other Ambulatory Visit: Payer: BC Managed Care – PPO

## 2021-10-14 ENCOUNTER — Other Ambulatory Visit: Payer: Self-pay | Admitting: Family Medicine

## 2021-10-14 DIAGNOSIS — I10 Essential (primary) hypertension: Secondary | ICD-10-CM

## 2021-10-16 ENCOUNTER — Other Ambulatory Visit (HOSPITAL_BASED_OUTPATIENT_CLINIC_OR_DEPARTMENT_OTHER): Payer: Self-pay

## 2021-10-16 MED ORDER — HYDROCHLOROTHIAZIDE 25 MG PO TABS
25.0000 mg | ORAL_TABLET | Freq: Every day | ORAL | 3 refills | Status: DC
  Filled 2021-10-16: qty 90, 90d supply, fill #0
  Filled 2021-12-02 – 2022-03-08 (×2): qty 90, 90d supply, fill #1
  Filled 2022-06-19: qty 90, 90d supply, fill #2

## 2021-10-16 MED ORDER — FLUARIX QUADRIVALENT 0.5 ML IM SUSY
PREFILLED_SYRINGE | INTRAMUSCULAR | 0 refills | Status: DC
Start: 2021-10-16 — End: 2022-07-02
  Filled 2021-10-16: qty 0.5, 1d supply, fill #0

## 2021-11-28 IMAGING — MG MM BREAST LOCALIZATION CLIP
4 series · 4 of 12 positions shown · non-contrast
Comparison: Previous exam(s).

CLINICAL DATA: Status post ultrasound-guided core biopsy of RIGHT
breast mass.

EXAM:
3D DIAGNOSTIC RIGHT MAMMOGRAM POST ULTRASOUND BIOPSY

[R CC synth-2D]
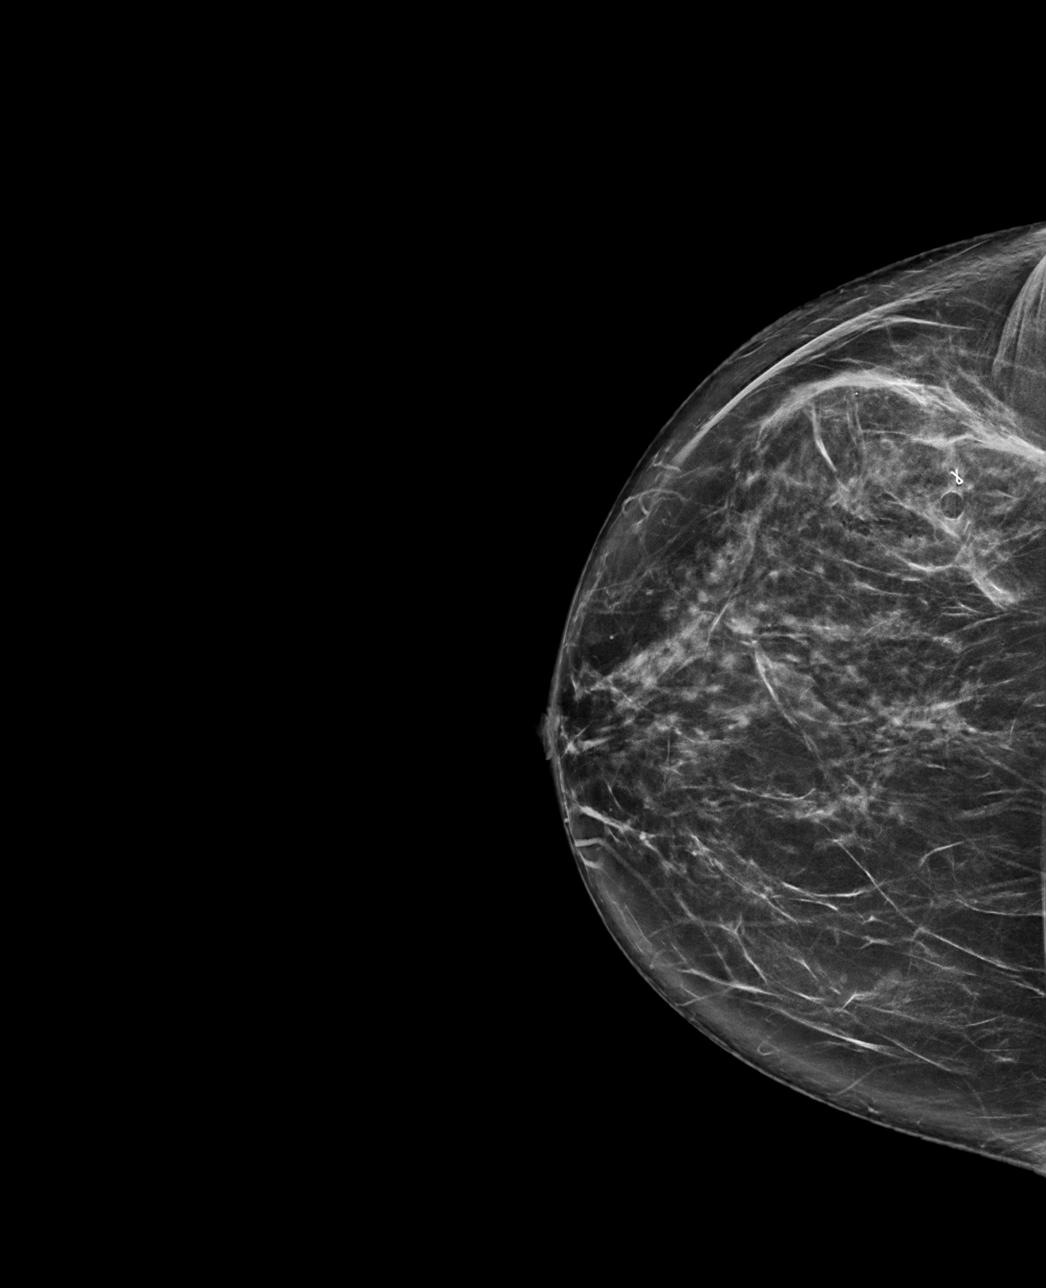

[R ML synth-2D]
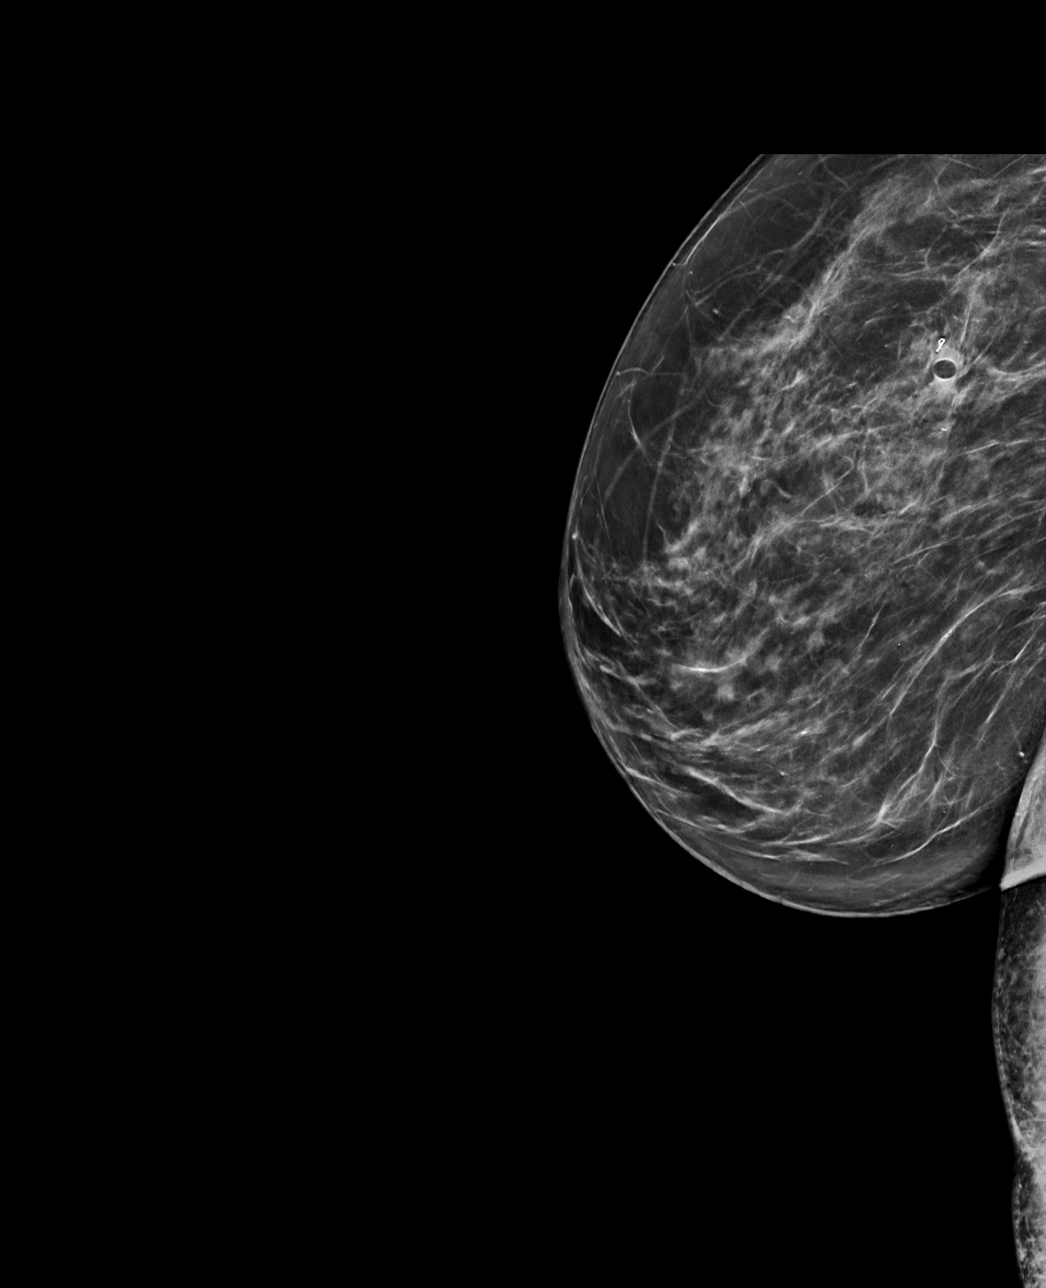

[R ML tomo · tomo slice 41/81.0]
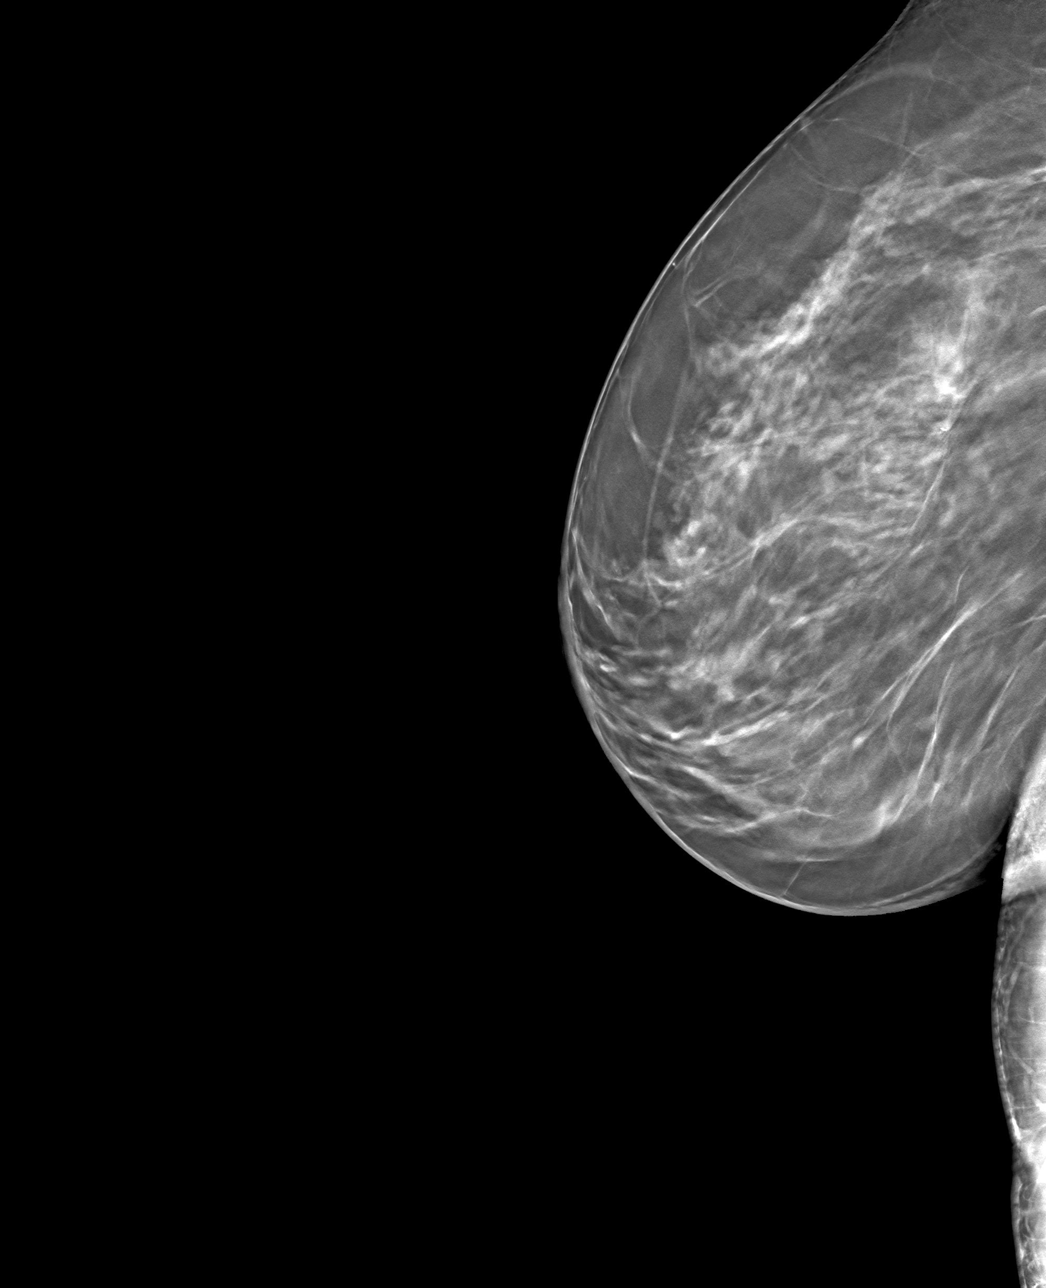

[R CC tomo · tomo slice 41/82.0]
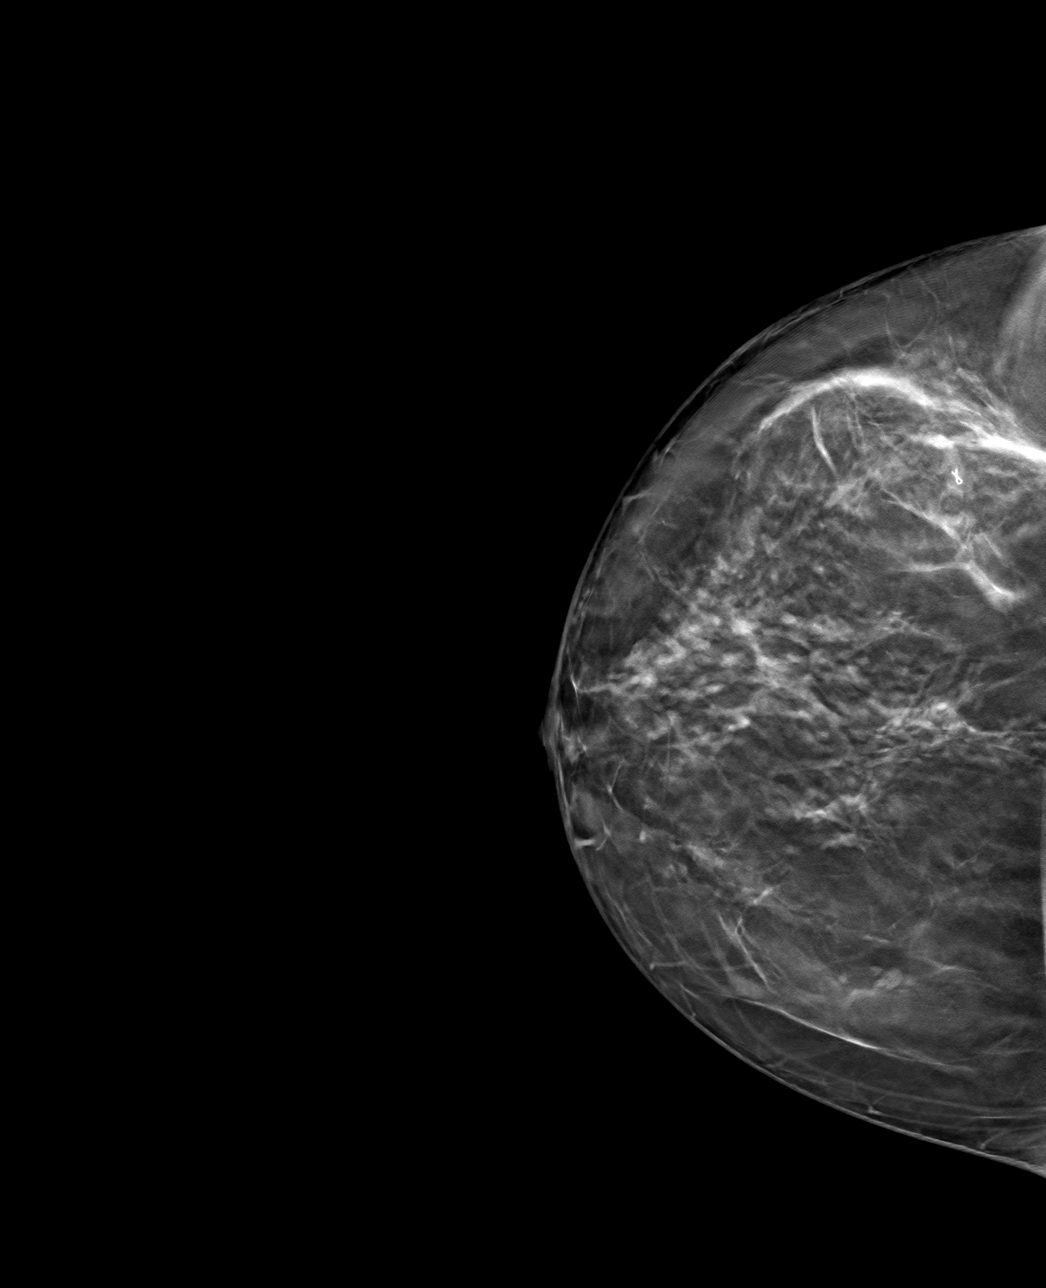

[4 of 12 positions shown; findings below may reference images not displayed]

FINDINGS: 3D Mammographic images were obtained following ultrasound guided
biopsy of mass in the 10:30 o'clock location of the RIGHT breast and
placement of a ribbon shaped clip. The biopsy marking clip is in
expected position at the site of biopsy.
IMPRESSION: Appropriate positioning of the ribbon shaped biopsy marking clip at
the site of biopsy in the UPPER-OUTER QUADRANT RIGHT breast.

Final Assessment: Post Procedure Mammograms for Marker Placement

## 2021-11-28 IMAGING — US US  BREAST BX W/ LOC DEV 1ST LESION IMG BX SPEC US GUIDE*R*
1 series · 11 of 11 positions shown · non-contrast
Comparison: Previous exam(s).
COMPARISON: Previous exam(s).

Addendum:
CLINICAL DATA: Patient presents for ultrasound-guided core biopsy
of RIGHT breast mass.

EXAM:
ULTRASOUND GUIDED RIGHT BREAST CORE NEEDLE BIOPSY

[Series 1: us breast bx w/ loc dev 1st lesion img bx spec us  · 0.07mm/px · 11 of 11 slices shown]
[im 1/11]
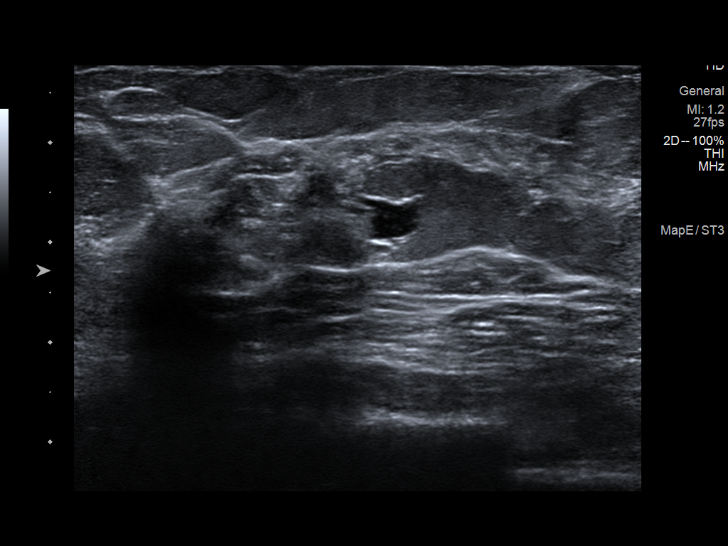
[im 2/11]
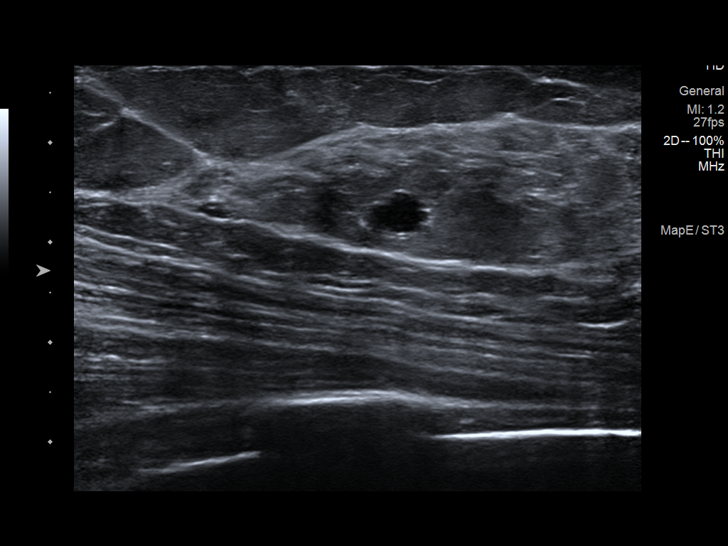
[im 3/11]
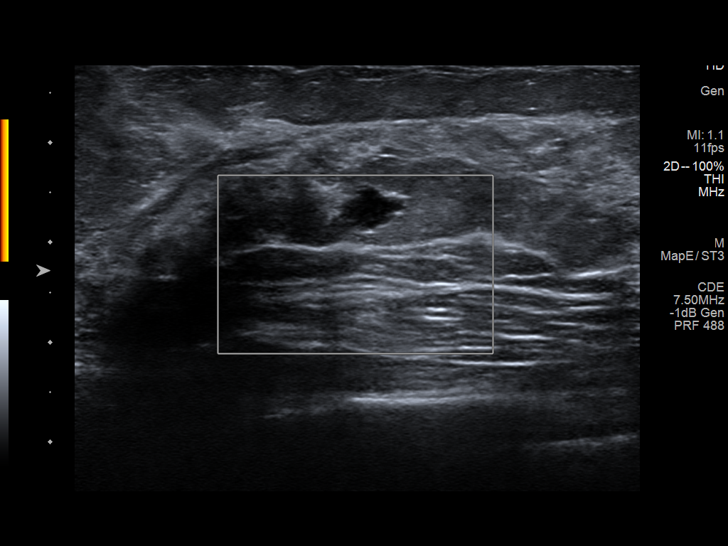
[im 4/11]
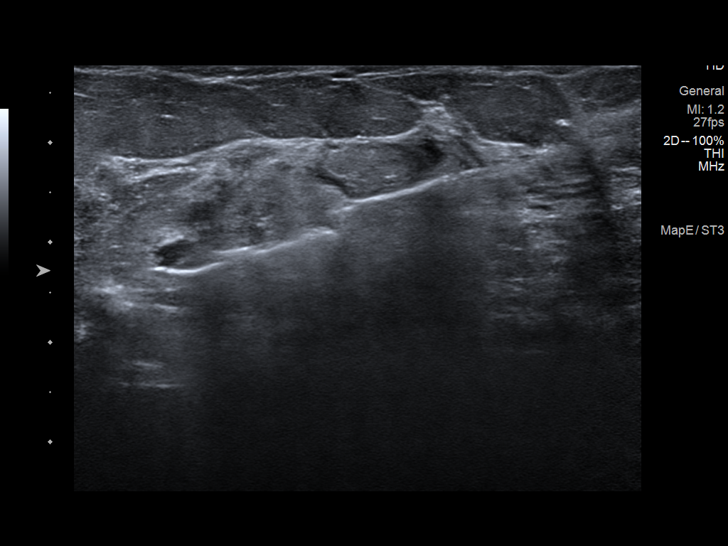
[im 5/11]
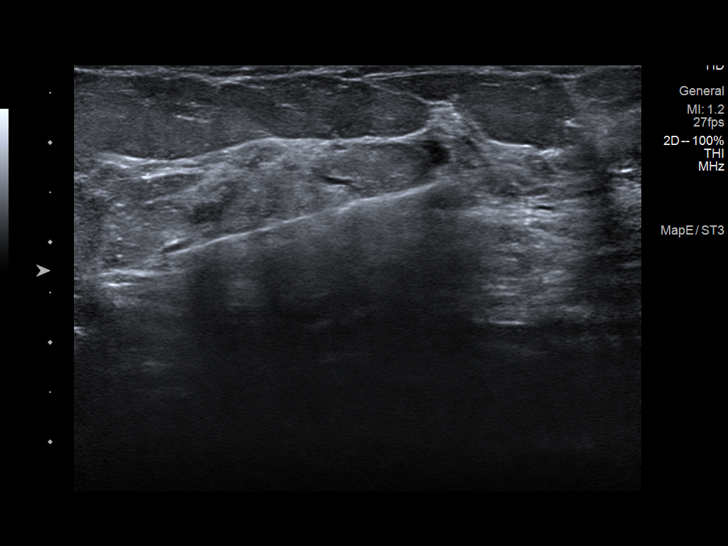
[im 6/11]
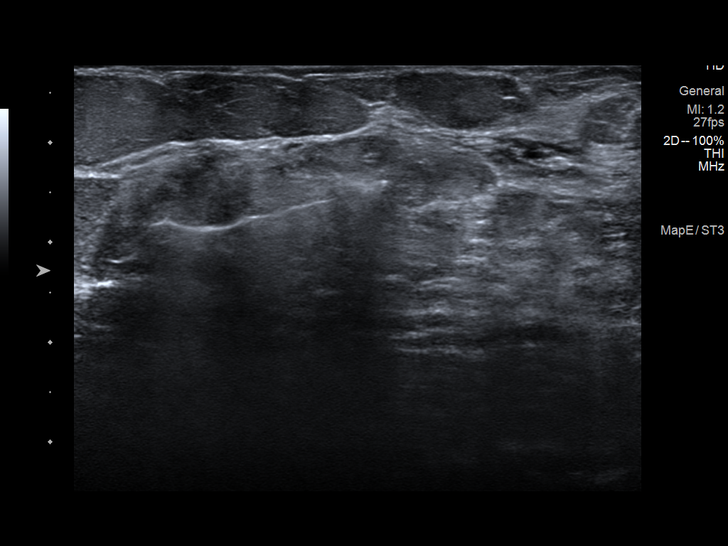
[im 7/11]
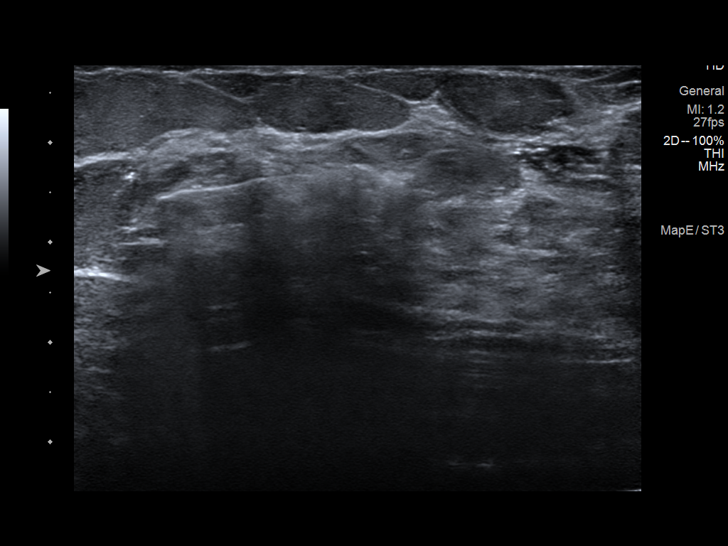
[im 8/11]
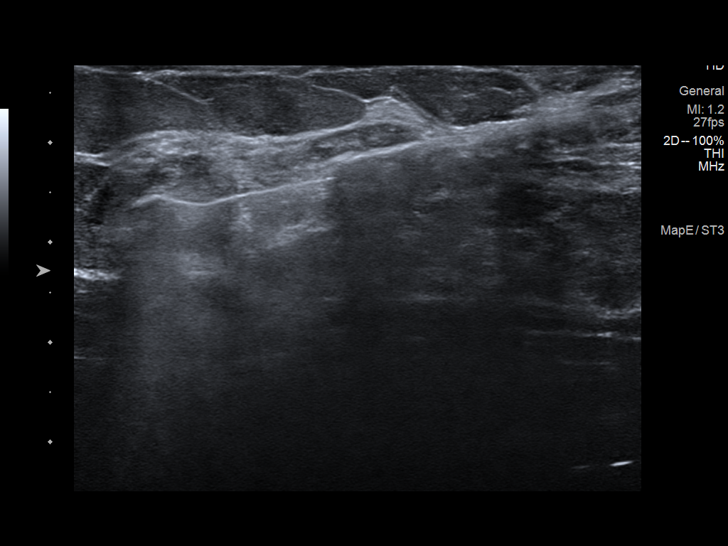
[im 9/11]
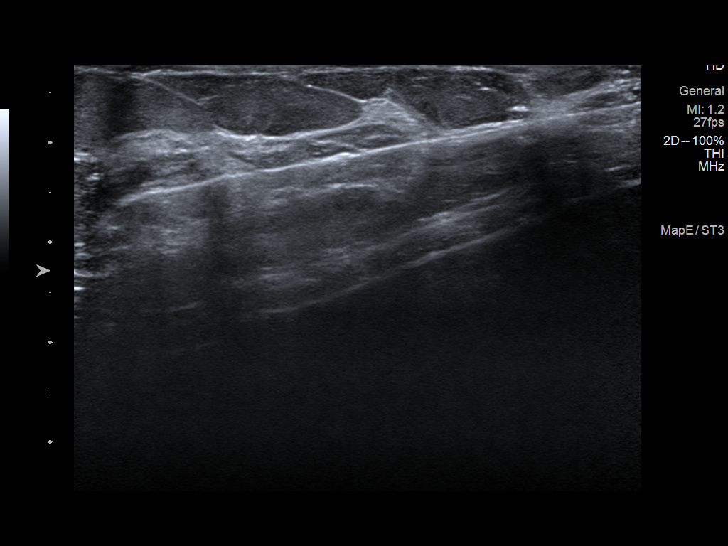
[im 10/11]
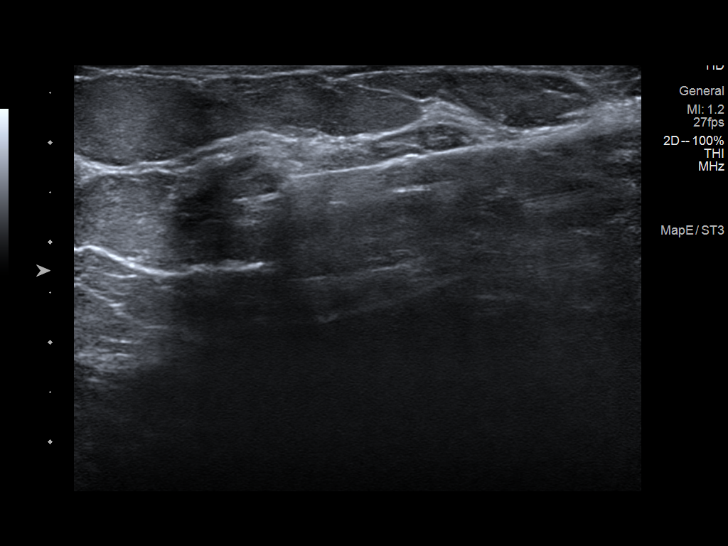
[im 11/11]
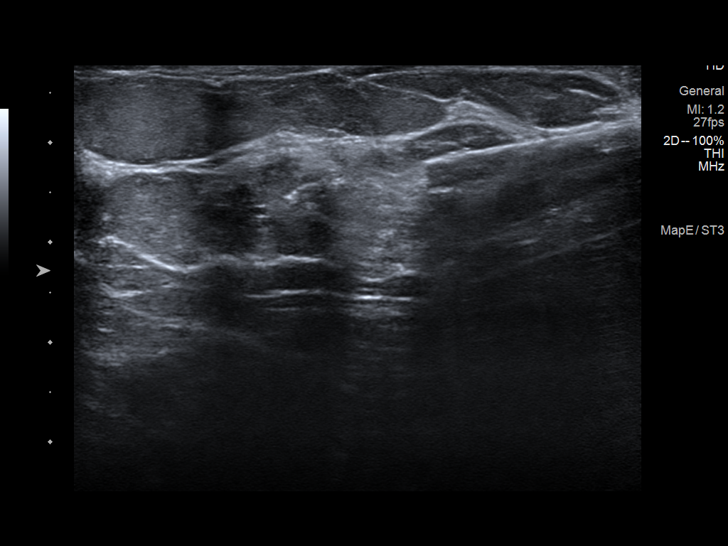

[11 of 11 positions shown; findings below may reference images not displayed]



Lesion quadrant: UPPER-OUTER QUADRANT RIGHT breast

Using sterile technique and 1% Lidocaine as local anesthetic, under
direct ultrasound visualization, a 12 gauge Tiger device was
used to perform biopsy of mass in the 10:30 o'clock location of the
RIGHT breast 7 centimeters from the nipple using a inferior to
superior approach. At the conclusion of the procedure ribbon shaped
tissue marker clip was deployed into the biopsy cavity. Follow up 2
view mammogram was performed and dictated separately.
IMPRESSION: Ultrasound guided biopsy of RIGHT breast mass. No apparent
complications.

ADDENDUM:
Pathology revealed FIBROCYSTIC CHANGES INCLUDING APOCRINE
METAPLASIA- NO MALIGNANCY IDENTIFIED of the RIGHT breast, 10:30
o'clock, 2cmfn, upper outer quadrant, ribbon clip. This was found to
be concordant by Dr. Bertin Soe.

Pathology results were discussed with the patient by telephone with
Paulus N Ceejay RN. The patient reported doing well after the biopsy
with tenderness at the site. Post biopsy instructions and care were
reviewed and questions were answered. The patient was encouraged to
call The [REDACTED] for any additional
concerns.

The patient was instructed to return to [REDACTED] for annual screening mammography due November 2021 and
informed a reminder notice would be sent regarding this appointment.

Pathology results reported by Yu Zatarain RN on 11/21/2020.



Lesion quadrant: UPPER-OUTER QUADRANT RIGHT breast

Using sterile technique and 1% Lidocaine as local anesthetic, under
direct ultrasound visualization, a 12 gauge Tiger device was
used to perform biopsy of mass in the 10:30 o'clock location of the
RIGHT breast 7 centimeters from the nipple using a inferior to
superior approach. At the conclusion of the procedure ribbon shaped
tissue marker clip was deployed into the biopsy cavity. Follow up 2
view mammogram was performed and dictated separately.
IMPRESSION: Ultrasound guided biopsy of RIGHT breast mass. No apparent
complications.

## 2021-12-02 ENCOUNTER — Other Ambulatory Visit (HOSPITAL_BASED_OUTPATIENT_CLINIC_OR_DEPARTMENT_OTHER): Payer: Self-pay

## 2021-12-02 ENCOUNTER — Other Ambulatory Visit: Payer: Self-pay | Admitting: Family Medicine

## 2021-12-02 DIAGNOSIS — F32A Depression, unspecified: Secondary | ICD-10-CM

## 2021-12-02 MED ORDER — FETZIMA 80 MG PO CP24
80.0000 mg | ORAL_CAPSULE | Freq: Every day | ORAL | 1 refills | Status: DC
  Filled 2021-12-02: qty 90, 90d supply, fill #0
  Filled 2022-04-17: qty 90, 90d supply, fill #1

## 2021-12-04 ENCOUNTER — Other Ambulatory Visit (HOSPITAL_BASED_OUTPATIENT_CLINIC_OR_DEPARTMENT_OTHER): Payer: Self-pay

## 2021-12-05 ENCOUNTER — Other Ambulatory Visit (HOSPITAL_BASED_OUTPATIENT_CLINIC_OR_DEPARTMENT_OTHER): Payer: Self-pay

## 2021-12-08 ENCOUNTER — Telehealth: Payer: BC Managed Care – PPO | Admitting: Physician Assistant

## 2021-12-08 DIAGNOSIS — N3000 Acute cystitis without hematuria: Secondary | ICD-10-CM | POA: Diagnosis not present

## 2021-12-08 MED ORDER — NITROFURANTOIN MONOHYD MACRO 100 MG PO CAPS: 100.0000 mg | ORAL_CAPSULE | Freq: Two times a day (BID) | ORAL | 0 refills | Status: AC

## 2021-12-08 NOTE — Progress Notes (Signed)
E-Visit for Urinary Problems  We are sorry that you are not feeling well.  Here is how we plan to help!  Based on what you shared with me it looks like you most likely have a simple urinary tract infection.  A UTI (Urinary Tract Infection) is a bacterial infection of the bladder.  Most cases of urinary tract infections are simple to treat but a key part of your care is to encourage you to drink plenty of fluids and watch your symptoms carefully.  I have prescribed MacroBid 100 mg twice a day for 5 days.  Your symptoms should gradually improve. Call us if the burning in your urine worsens, you develop worsening fever, back pain or pelvic pain or if your symptoms do not resolve after completing the antibiotic.  Urinary tract infections can be prevented by drinking plenty of water to keep your body hydrated.  Also be sure when you wipe, wipe from front to back and don't hold it in!  If possible, empty your bladder every 4 hours.  HOME CARE Drink plenty of fluids Compete the full course of the antibiotics even if the symptoms resolve Remember, when you need to go.go. Holding in your urine can increase the likelihood of getting a UTI! GET HELP RIGHT AWAY IF: You cannot urinate You get a high fever Worsening back pain occurs You see blood in your urine You feel sick to your stomach or throw up You feel like you are going to pass out  MAKE SURE YOU  Understand these instructions. Will watch your condition. Will get help right away if you are not doing well or get worse.   Thank you for choosing an e-visit.  Your e-visit answers were reviewed by a board certified advanced clinical practitioner to complete your personal care plan. Depending upon the condition, your plan could have included both over the counter or prescription medications.  Please review your pharmacy choice. Make sure the pharmacy is open so you can pick up prescription now. If there is a problem, you may contact your  provider through [employer] and have the prescription routed to another pharmacy.  Your safety is important to [place]. If you have drug allergies check your prescription carefully.   For the next 24 hours you can use MyChart to ask questions about today's visit, request a non-urgent call back, or ask for a work or school excuse. You will get an email in the next two days asking about your experience. I hope that your e-visit has been valuable and will speed your recovery.   I have spent 5 minutes in review of e-visit questionnaire, review and updating patient chart, medical decision making and response to patient.   Christeen Douglas, PA-C

## 2022-02-06 ENCOUNTER — Other Ambulatory Visit (HOSPITAL_BASED_OUTPATIENT_CLINIC_OR_DEPARTMENT_OTHER): Payer: Self-pay

## 2022-03-08 ENCOUNTER — Other Ambulatory Visit (HOSPITAL_BASED_OUTPATIENT_CLINIC_OR_DEPARTMENT_OTHER): Payer: Self-pay

## 2022-03-09 ENCOUNTER — Other Ambulatory Visit: Payer: Self-pay

## 2022-03-12 ENCOUNTER — Other Ambulatory Visit (HOSPITAL_BASED_OUTPATIENT_CLINIC_OR_DEPARTMENT_OTHER): Payer: Self-pay

## 2022-04-17 ENCOUNTER — Telehealth: Payer: BC Managed Care – PPO | Admitting: Nurse Practitioner

## 2022-04-17 ENCOUNTER — Other Ambulatory Visit (HOSPITAL_BASED_OUTPATIENT_CLINIC_OR_DEPARTMENT_OTHER): Payer: Self-pay

## 2022-04-17 ENCOUNTER — Other Ambulatory Visit: Payer: Self-pay

## 2022-04-17 DIAGNOSIS — N3000 Acute cystitis without hematuria: Secondary | ICD-10-CM

## 2022-04-17 MED ORDER — CEPHALEXIN 500 MG PO CAPS
500.0000 mg | ORAL_CAPSULE | Freq: Two times a day (BID) | ORAL | 0 refills | Status: AC
  Filled 2022-04-17: qty 14, 7d supply, fill #0

## 2022-04-17 NOTE — Progress Notes (Signed)

## 2022-04-18 ENCOUNTER — Other Ambulatory Visit (HOSPITAL_BASED_OUTPATIENT_CLINIC_OR_DEPARTMENT_OTHER): Payer: Self-pay

## 2022-05-17 ENCOUNTER — Other Ambulatory Visit (HOSPITAL_BASED_OUTPATIENT_CLINIC_OR_DEPARTMENT_OTHER): Payer: Self-pay

## 2022-05-17 DIAGNOSIS — L719 Rosacea, unspecified: Secondary | ICD-10-CM | POA: Diagnosis not present

## 2022-05-17 DIAGNOSIS — Z411 Encounter for cosmetic surgery: Secondary | ICD-10-CM | POA: Diagnosis not present

## 2022-05-17 MED ORDER — TRETINOIN 0.1 % EX CREA
1.0000 | TOPICAL_CREAM | CUTANEOUS | 3 refills | Status: AC
  Filled 2022-05-17: qty 45, 30d supply, fill #0
  Filled 2023-03-04: qty 45, 30d supply, fill #1

## 2022-06-19 ENCOUNTER — Other Ambulatory Visit (HOSPITAL_BASED_OUTPATIENT_CLINIC_OR_DEPARTMENT_OTHER): Payer: Self-pay

## 2022-06-19 ENCOUNTER — Other Ambulatory Visit: Payer: Self-pay | Admitting: Family Medicine

## 2022-06-19 ENCOUNTER — Other Ambulatory Visit: Payer: Self-pay

## 2022-06-19 ENCOUNTER — Telehealth: Payer: Self-pay | Admitting: Family Medicine

## 2022-06-19 DIAGNOSIS — I10 Essential (primary) hypertension: Secondary | ICD-10-CM

## 2022-06-19 MED ORDER — METOPROLOL SUCCINATE ER 100 MG PO TB24
100.0000 mg | ORAL_TABLET | Freq: Every day | ORAL | 0 refills | Status: DC
Start: 2022-06-19 — End: 2022-06-19
  Filled 2022-06-19: qty 30, 30d supply, fill #0

## 2022-06-19 MED ORDER — METFORMIN HCL 500 MG PO TABS
500.0000 mg | ORAL_TABLET | Freq: Two times a day (BID) | ORAL | 0 refills | Status: DC
  Filled 2022-06-19: qty 60, 30d supply, fill #0

## 2022-06-19 MED ORDER — METOPROLOL SUCCINATE ER 100 MG PO TB24
100.0000 mg | ORAL_TABLET | Freq: Every day | ORAL | 0 refills | Status: DC
Start: 2022-06-19 — End: 2022-07-02
  Filled 2022-06-19: qty 30, 30d supply, fill #0

## 2022-06-19 NOTE — Telephone Encounter (Signed)
Patient last office visit was 07/10/2021 and next appointment 07/02/2022 patient given #[phone] with 0 refills.

## 2022-06-19 NOTE — Telephone Encounter (Signed)
Medication refill request    Medication: metoprolol succinate (TOPROL-XL) 100 MG 24 hr tablet   Pharmacy: [facility]   Last Visit: 7.3.23  Next Visit: 6.24.24

## 2022-06-20 ENCOUNTER — Other Ambulatory Visit: Payer: Self-pay

## 2022-07-02 ENCOUNTER — Other Ambulatory Visit (HOSPITAL_BASED_OUTPATIENT_CLINIC_OR_DEPARTMENT_OTHER): Payer: Self-pay

## 2022-07-02 ENCOUNTER — Encounter: Payer: Self-pay | Admitting: Family Medicine

## 2022-07-02 ENCOUNTER — Ambulatory Visit: Payer: BC Managed Care – PPO | Admitting: Family Medicine

## 2022-07-02 VITALS — BP 100/80 | HR 62 | Temp 97.9°F | Resp 18 | Ht 62.0 in | Wt 168.4 lb

## 2022-07-02 DIAGNOSIS — R7303 Prediabetes: Secondary | ICD-10-CM

## 2022-07-02 DIAGNOSIS — E785 Hyperlipidemia, unspecified: Secondary | ICD-10-CM | POA: Diagnosis not present

## 2022-07-02 DIAGNOSIS — I10 Essential (primary) hypertension: Secondary | ICD-10-CM | POA: Diagnosis not present

## 2022-07-02 DIAGNOSIS — F329 Major depressive disorder, single episode, unspecified: Secondary | ICD-10-CM

## 2022-07-02 DIAGNOSIS — N644 Mastodynia: Secondary | ICD-10-CM | POA: Diagnosis not present

## 2022-07-02 DIAGNOSIS — E88819 Insulin resistance, unspecified: Secondary | ICD-10-CM

## 2022-07-02 DIAGNOSIS — M545 Low back pain, unspecified: Secondary | ICD-10-CM

## 2022-07-02 DIAGNOSIS — F320 Major depressive disorder, single episode, mild: Secondary | ICD-10-CM | POA: Diagnosis not present

## 2022-07-02 LAB — CBC WITH DIFFERENTIAL/PLATELET
Basophils Absolute: 0 10*3/uL (ref 0.0–0.1)
Basophils Relative: 0.4 % (ref 0.0–3.0)
Eosinophils Absolute: 0.1 10*3/uL (ref 0.0–0.7)
Eosinophils Relative: 2 % (ref 0.0–5.0)
HCT: 43.3 % (ref 36.0–46.0)
Hemoglobin: 14.3 g/dL (ref 12.0–15.0)
Lymphocytes Relative: 47.4 % — ABNORMAL HIGH (ref 12.0–46.0)
Lymphs Abs: 1.9 10*3/uL (ref 0.7–4.0)
MCHC: 32.9 g/dL (ref 30.0–36.0)
MCV: 92.1 fl (ref 78.0–100.0)
Monocytes Absolute: 0.4 10*3/uL (ref 0.1–1.0)
Monocytes Relative: 10.6 % (ref 3.0–12.0)
Neutro Abs: 1.6 10*3/uL (ref 1.4–7.7)
Neutrophils Relative %: 39.6 % — ABNORMAL LOW (ref 43.0–77.0)
Platelets: 294 10*3/uL (ref 150.0–400.0)
RBC: 4.7 Mil/uL (ref 3.87–5.11)
RDW: 13.8 % (ref 11.5–15.5)
WBC: 4.1 10*3/uL (ref 4.0–10.5)

## 2022-07-02 LAB — LIPID PANEL
Cholesterol: 250 mg/dL — ABNORMAL HIGH (ref 0–200)
HDL: 64 mg/dL (ref >39.00)
LDL Cholesterol: 156 mg/dL — ABNORMAL HIGH (ref 0–99)
NonHDL: 186.27
Total CHOL/HDL Ratio: 4
Triglycerides: 150 mg/dL — ABNORMAL HIGH (ref 0.0–149.0)
VLDL: 30 mg/dL (ref 0.0–40.0)

## 2022-07-02 LAB — COMPREHENSIVE METABOLIC PANEL
ALT: 11 U/L (ref 0–35)
AST: 14 U/L (ref 0–37)
Albumin: 4.2 g/dL (ref 3.5–5.2)
Alkaline Phosphatase: 60 U/L (ref 39–117)
BUN: 15 mg/dL (ref 6–23)
CO2: 30 mEq/L (ref 19–32)
Calcium: 9.5 mg/dL (ref 8.4–10.5)
Chloride: 100 mEq/L (ref 96–112)
Creatinine, Ser: 1.41 mg/dL — ABNORMAL HIGH (ref 0.40–1.20)
GFR: 44.55 mL/min — ABNORMAL LOW (ref >60.00)
Glucose, Bld: 80 mg/dL (ref 70–99)
Potassium: 3.9 mEq/L (ref 3.5–5.1)
Sodium: 137 mEq/L (ref 135–145)
Total Bilirubin: 1.1 mg/dL (ref 0.2–1.2)
Total Protein: 7.3 g/dL (ref 6.0–8.3)

## 2022-07-02 LAB — HEMOGLOBIN A1C: Hgb A1c MFr Bld: 5.7 % (ref 4.6–6.5)

## 2022-07-02 MED ORDER — HYDROCHLOROTHIAZIDE 25 MG PO TABS
25.0000 mg | ORAL_TABLET | Freq: Every day | ORAL | 3 refills | Status: DC
Start: 2022-07-02 — End: 2023-07-15
  Filled 2022-07-02 – 2023-03-04 (×2): qty 90, 90d supply, fill #0

## 2022-07-02 MED ORDER — AMLODIPINE BESYLATE 5 MG PO TABS
5.0000 mg | ORAL_TABLET | Freq: Every day | ORAL | 1 refills | Status: DC
Start: 2022-07-02 — End: 2023-07-15
  Filled 2022-07-02: qty 90, 90d supply, fill #0

## 2022-07-02 MED ORDER — METFORMIN HCL 500 MG PO TABS
500.0000 mg | ORAL_TABLET | Freq: Two times a day (BID) | ORAL | 1 refills | Status: AC
Start: 2022-07-02 — End: ?
  Filled 2022-07-02 – 2022-12-12 (×2): qty 180, 90d supply, fill #0
  Filled 2023-03-04: qty 180, 90d supply, fill #1

## 2022-07-02 MED ORDER — METOPROLOL SUCCINATE ER 100 MG PO TB24
100.0000 mg | ORAL_TABLET | Freq: Every day | ORAL | 1 refills | Status: DC
Start: 2022-07-02 — End: 2023-07-15
  Filled 2022-07-02 – 2022-08-27 (×2): qty 90, 90d supply, fill #0
  Filled 2023-03-04: qty 90, 90d supply, fill #1

## 2022-07-02 MED ORDER — BUPROPION HCL ER (XL) 300 MG PO TB24
300.0000 mg | ORAL_TABLET | Freq: Every day | ORAL | 3 refills | Status: DC
Start: 2022-07-02 — End: 2023-07-15
  Filled 2022-07-02 – 2023-03-04 (×2): qty 90, 90d supply, fill #0

## 2022-07-02 NOTE — Assessment & Plan Note (Signed)
hgba1c to be checked, minimize simple carbs. Increase exercise as tolerated. Continue current meds  

## 2022-07-02 NOTE — Assessment & Plan Note (Signed)
Stable Cont Fetzima

## 2022-07-02 NOTE — Progress Notes (Addendum)
Established Patient Office Visit  Subjective   Patient ID: Crystal Sandoval, female    DOB: 09-21-1975  Age: 47 y.o. MRN: [id number]  Chief Complaint  Patient presents with   Hypertension   Follow-up    HPI Discussed the use of AI scribe software for clinical note transcription with the patient, who gave verbal consent to proceed.  History of Present Illness   The patient, with a history of hypertension, presents with a four-day history of back pain. The pain is located in the midline of the lower back, radiating to the top of the buttocks bilaterally. The pain is worse when transitioning from sitting to standing and vice versa, but is relieved when the patient is either fully standing or lying down. The patient denies any radicular symptoms. The pain started a couple of days after a long workday, but the patient cannot recall any specific injury or unusual activity. The patient has been managing the pain with ibuprofen and heat, which provide some relief.  In addition to the back pain, the patient also reports intermittent sharp pain in the left breast. The pain is brief, lasting only a few seconds to a minute, and has been occurring sporadically. The patient denies feeling any lumps or masses in the breast.      Patient Active Problem List   Diagnosis Date Noted   Preventative health care 05/06/2020   Primary hypertension 05/29/2018   Prediabetes 03/11/2018   Morbid obesity (HCC) 02/19/2018   Lymphadenopathy 07/01/2017   Obesity (BMI 30.0-34.9) 07/18/2015   HLD (hyperlipidemia) 12/12/2013   Avitaminosis D 02/19/2013   Major depressive disorder 10/12/2011   Past Medical History:  Diagnosis Date   Allergy    Asthma    Constipation    Depression    Frequent headaches    GERD (gastroesophageal reflux disease)    High cholesterol    Hypertension    Kidney problem    Lactose intolerance    Migraine    Vitamin D deficiency    Past Surgical History:  Procedure Laterality  Date   WISDOM TOOTH EXTRACTION     Social History   Tobacco Use   Smoking status: Never   Smokeless tobacco: Never  Substance Use Topics   Alcohol use: Yes    Alcohol/week: 0.0 standard drinks of alcohol    Comment: Occ   Drug use: Never   Social History   Socioeconomic History   Marital status: Married    Spouse name: Dorian Heckle   Number of children: Not on file   Years of education: Not on file   Highest education level: Not on file  Occupational History   Occupation: Editor, commissioning    Employer: [employer]    Comment: surgical tech  Tobacco Use   Smoking status: Never   Smokeless tobacco: Never  Substance and Sexual Activity   Alcohol use: Yes    Alcohol/week: 0.0 standard drinks of alcohol    Comment: Occ   Drug use: Never   Sexual activity: Yes  Other Topics Concern   Not on file  Social History Narrative   Not on file   Social Determinants of Health   Financial Resource Strain: Not on file  Food Insecurity: Not on file  Transportation Needs: Not on file  Physical Activity: Not on file  Stress: Not on file  Social Connections: Not on file  Intimate Partner Violence: Not on file   Family Status  Relation Name Status   Mother  Alive   Father  Alive   MGM  Deceased   PGM  Deceased   MGF  Deceased   PGF  Deceased   Family History  Problem Relation Age of Onset   Alcohol abuse Mother    Drug abuse Mother    High Cholesterol Mother    High blood pressure Father    High Cholesterol Father    Depression Father    Breast cancer Maternal Grandmother    Hypertension Maternal Grandmother    Breast cancer Paternal Grandmother    Hypertension Paternal Grandmother    Hypertension Maternal Grandfather    Hypertension Paternal Grandfather    Allergies  Allergen Reactions   Ibuprofen     Elevated kidney function      Review of Systems  Constitutional:  Negative for fever and malaise/fatigue.  HENT:  Negative for congestion.   Eyes:  Negative  for blurred vision.  Respiratory:  Negative for cough and shortness of breath.   Cardiovascular:  Negative for chest pain, palpitations and leg swelling.  Gastrointestinal:  Negative for abdominal pain, blood in stool, nausea and vomiting.  Genitourinary:  Negative for dysuria and frequency.  Musculoskeletal:  Negative for back pain and falls.  Skin:  Negative for rash.  Neurological:  Negative for dizziness, loss of consciousness and headaches.  Endo/Heme/Allergies:  Negative for environmental allergies.  Psychiatric/Behavioral:  Negative for depression. The patient is not nervous/anxious.       Objective:     BP 100/80 (BP Location: Left Arm, Patient Position: Sitting, Cuff Size: Normal)   Pulse 62   Temp 97.9 F (36.6 C) (Oral)   Resp 18   Ht 5\' 2"  (1.575 m)   Wt 168 lb 6.4 oz (76.4 kg)   SpO2 99%   BMI 30.80 kg/m  BP Readings from Last 3 Encounters:  07/02/22 100/80  07/10/21 138/80  11/07/20 134/74   Wt Readings from Last 3 Encounters:  07/02/22 168 lb 6.4 oz (76.4 kg)  07/10/21 157 lb (71.2 kg)  11/07/20 188 lb (85.3 kg)   SpO2 Readings from Last 3 Encounters:  07/02/22 99%  07/10/21 99%  11/07/20 100%      Physical Exam Vitals and nursing note reviewed.  Constitutional:      Appearance: She is well-developed.  HENT:     Head: Normocephalic and atraumatic.  Eyes:     Conjunctiva/sclera: Conjunctivae normal.  Neck:     Thyroid: No thyromegaly.     Vascular: No carotid bruit or JVD.  Cardiovascular:     Rate and Rhythm: Normal rate and regular rhythm.     Heart sounds: Normal heart sounds. No murmur heard. Pulmonary:     Effort: Pulmonary effort is normal. No respiratory distress.     Breath sounds: Normal breath sounds. No wheezing or rales.  Chest:     Chest wall: No tenderness.       Comments: Pain in L breast  / axilla with palpation -- no mass  Abdominal:     Palpations: There is no mass.  Musculoskeletal:        General: Tenderness  present.     Cervical back: Normal range of motion and neck supple.     Lumbar back: Spasms and tenderness present. No bony tenderness. Decreased range of motion. Negative right straight leg raise test and negative left straight leg raise test.     Comments: Pain goes in low back and radiates to both buttocks   Neurological:  General: No focal deficit present.     Mental Status: She is alert and oriented to person, place, and time.     Gait: Gait normal.  Psychiatric:        Mood and Affect: Mood normal.     No results found for any visits on 07/02/22.  Last CBC Lab Results  Component Value Date   WBC 4.5 03/22/2020   HGB 14.2 03/22/2020   HCT 41.9 03/22/2020   MCV 89.0 03/22/2020   MCH 30.2 02/18/2018   RDW 12.8 03/22/2020   PLT 283.0 03/22/2020   Last metabolic panel Lab Results  Component Value Date   GLUCOSE 79 06/26/2021   NA 139 06/26/2021   K 3.7 06/26/2021   CL 103 06/26/2021   CO2 30 06/26/2021   BUN 12 06/26/2021   CREATININE 1.31 (H) 06/26/2021   GFRNONAA 63 02/18/2018   CALCIUM 9.1 06/26/2021   PROT 6.8 06/26/2021   ALBUMIN 3.9 06/26/2021   LABGLOB 2.8 02/18/2018   AGRATIO 1.6 02/18/2018   BILITOT 1.1 06/26/2021   ALKPHOS 57 06/26/2021   AST 15 06/26/2021   ALT 11 06/26/2021   Last lipids Lab Results  Component Value Date   CHOL 239 (H) 06/26/2021   HDL 56.30 06/26/2021   LDLCALC 156 (H) 06/26/2021   TRIG 136.0 06/26/2021   CHOLHDL 4 06/26/2021   Last hemoglobin A1c Lab Results  Component Value Date   HGBA1C 5.7 06/26/2021   Last thyroid functions Lab Results  Component Value Date   TSH 0.94 03/22/2020   T3TOTAL 112 02/18/2018   Last vitamin D Lab Results  Component Value Date   VD25OH 29.81 (L) 03/22/2020   Last vitamin B12 and Folate Lab Results  Component Value Date   VITAMINB12 1,489 (H) 02/18/2018   FOLATE 9.2 02/18/2018      The 10-year ASCVD risk score (Arnett DK, et al., 2019) is: 1%   EKG---sinus ,  no change  since 03/2020 Assessment & Plan:   Problem List Items Addressed This Visit       Unprioritized   Morbid obesity (HCC)   Relevant Medications   metFORMIN (GLUCOPHAGE) 500 MG tablet   Other Relevant Orders   Hemoglobin A1c   Insulin, random   Primary hypertension - Primary    Well controlled, no changes to meds. Encouraged heart healthy diet such as the DASH diet and exercise as tolerated.        Relevant Medications   amLODipine (NORVASC) 5 MG tablet   hydrochlorothiazide (HYDRODIURIL) 25 MG tablet   metoprolol succinate (TOPROL-XL) 100 MG 24 hr tablet   Other Relevant Orders   Lipid panel   CBC with Differential/Platelet   Comprehensive metabolic panel   Prediabetes    hgba1c to be checked , minimize simple carbs. Increase exercise as tolerated. Continue current meds       Major depressive disorder    Stable Cont Fetzima       Relevant Medications   buPROPion (WELLBUTRIN XL) 300 MG 24 hr tablet   HLD (hyperlipidemia)    Encourage heart healthy diet such as MIND or DASH diet, increase exercise, avoid trans fats, simple carbohydrates and processed foods, consider a krill or fish or flaxseed oil cap daily.        Relevant Medications   amLODipine (NORVASC) 5 MG tablet   hydrochlorothiazide (HYDRODIURIL) 25 MG tablet   metoprolol succinate (TOPROL-XL) 100 MG 24 hr tablet   Other Relevant Orders   Lipid panel   Comprehensive  metabolic panel   Other Visit Diagnoses     Depression, major, single episode, mild (HCC)       Relevant Medications   buPROPion (WELLBUTRIN XL) 300 MG 24 hr tablet   Insulin resistance       Relevant Medications   metFORMIN (GLUCOPHAGE) 500 MG tablet   Other Relevant Orders   Hemoglobin A1c   Low back pain with radiation       Relevant Orders   DG Lumbar Spine Complete   Breast pain, left       Relevant Orders   MM 3D DIAGNOSTIC MAMMOGRAM BILATERAL BREAST   US BREAST COMPLETE UNI LEFT INC AXILLA   EKG 12-Lead (Completed)      Assessment and Plan    Lower Back Pain: New onset, worse with sitting and standing up, no radiation down the legs. Likely musculoskeletal due to heavy lifting at work. Improving with heat and ibuprofen. -Continue heat and ibuprofen as needed. -Consider X-ray if not improved.  Breast Pain: Intermittent sharp pain in left breast, no palpable mass. Stress related vs cyclical. Overdue for mammogram. -Perform EKG to rule out cardiac cause. -Recommend mammogram.  Hypertension: Stable, no changes in medication. -Refill blood pressure medications.  General Health Maintenance: -Overdue for routine screenings, encourage patient to schedule.        No follow-ups on file.    Donato Schultz, DO

## 2022-07-02 NOTE — Assessment & Plan Note (Signed)
Encourage heart healthy diet such as MIND or DASH diet, increase exercise, avoid trans fats, simple carbohydrates and processed foods, consider a krill or fish or flaxseed oil cap daily.  °

## 2022-07-02 NOTE — Assessment & Plan Note (Signed)
Well controlled, no changes to meds. Encouraged heart healthy diet such as the DASH diet and exercise as tolerated.  °

## 2022-07-03 LAB — INSULIN, RANDOM: Insulin: 48.6 u[IU]/mL — ABNORMAL HIGH

## 2022-07-10 ENCOUNTER — Telehealth: Payer: Self-pay

## 2022-07-10 NOTE — Telephone Encounter (Signed)
Pt called back with concerns about her Creatine and GFR labs. She did read her letter via MyChart but says she wanted to know if you had any concerns about these levels/changes. She takes Metformin and wants to make sure it is safe to continue.   She also mentioned her Insulin result. She says it has improved some but not as much as she'd like. And would like to know if you think the Metformin is helping in this regard.   CB number is: [phone]

## 2022-07-11 ENCOUNTER — Other Ambulatory Visit: Payer: Self-pay | Admitting: Family Medicine

## 2022-07-11 DIAGNOSIS — I10 Essential (primary) hypertension: Secondary | ICD-10-CM

## 2022-07-11 NOTE — Telephone Encounter (Signed)
Please advise regarding the Metformin

## 2022-07-13 ENCOUNTER — Other Ambulatory Visit (HOSPITAL_BASED_OUTPATIENT_CLINIC_OR_DEPARTMENT_OTHER): Payer: Self-pay

## 2022-07-17 NOTE — Telephone Encounter (Signed)
Pt called. LVM to call back. °

## 2022-08-27 ENCOUNTER — Other Ambulatory Visit (HOSPITAL_BASED_OUTPATIENT_CLINIC_OR_DEPARTMENT_OTHER): Payer: Self-pay

## 2022-09-11 ENCOUNTER — Telehealth: Payer: BC Managed Care – PPO | Admitting: Physician Assistant

## 2022-09-11 DIAGNOSIS — R3989 Other symptoms and signs involving the genitourinary system: Secondary | ICD-10-CM | POA: Diagnosis not present

## 2022-09-11 MED ORDER — CEPHALEXIN 500 MG PO CAPS: 500.0000 mg | ORAL_CAPSULE | Freq: Two times a day (BID) | ORAL | 0 refills | Status: AC

## 2022-09-11 NOTE — Progress Notes (Signed)
I have spent 5 minutes in review of e-visit questionnaire, review and updating patient chart, medical decision making and response to patient.   William Cody Martin, PA-C    

## 2022-09-11 NOTE — Progress Notes (Signed)

## 2022-10-02 ENCOUNTER — Other Ambulatory Visit (HOSPITAL_BASED_OUTPATIENT_CLINIC_OR_DEPARTMENT_OTHER): Payer: Self-pay

## 2022-10-02 DIAGNOSIS — J4521 Mild intermittent asthma with (acute) exacerbation: Secondary | ICD-10-CM | POA: Diagnosis not present

## 2022-10-02 MED ORDER — PREDNISONE 5 MG PO TABS
ORAL_TABLET | ORAL | 0 refills | Status: DC
  Filled 2022-10-02: qty 21, 6d supply, fill #0

## 2022-10-02 MED ORDER — CETIRIZINE HCL 10 MG PO TABS
10.0000 mg | ORAL_TABLET | Freq: Every day | ORAL | 0 refills | Status: AC
  Filled 2022-10-02: qty 100, 100d supply, fill #0

## 2022-10-02 MED ORDER — FLUTICASONE PROPIONATE 50 MCG/ACT NA SUSP
NASAL | 0 refills | Status: AC
  Filled 2022-10-02: qty 16, 30d supply, fill #0

## 2022-10-02 MED ORDER — ALBUTEROL SULFATE HFA 108 (90 BASE) MCG/ACT IN AERS
INHALATION_SPRAY | RESPIRATORY_TRACT | 0 refills | Status: AC
  Filled 2022-10-02: qty 6.7, 25d supply, fill #0

## 2022-10-18 ENCOUNTER — Ambulatory Visit: Payer: BC Managed Care – PPO | Admitting: Family Medicine

## 2022-10-18 ENCOUNTER — Telehealth: Payer: Self-pay | Admitting: Family Medicine

## 2022-10-18 ENCOUNTER — Encounter: Payer: Self-pay | Admitting: Family Medicine

## 2022-10-18 DIAGNOSIS — Z6832 Body mass index (BMI) 32.0-32.9, adult: Secondary | ICD-10-CM | POA: Diagnosis not present

## 2022-10-18 DIAGNOSIS — R7303 Prediabetes: Secondary | ICD-10-CM | POA: Diagnosis not present

## 2022-10-18 DIAGNOSIS — E88819 Insulin resistance, unspecified: Secondary | ICD-10-CM

## 2022-10-18 MED ORDER — TIRZEPATIDE-WEIGHT MANAGEMENT 2.5 MG/0.5ML ~~LOC~~ SOLN: 2.5000 mg | SUBCUTANEOUS | 0 refills | Status: DC

## 2022-10-18 NOTE — Telephone Encounter (Signed)
Pt called stating that her pharmacy had informed her that the following medication needed a PA:  tirzepatide (ZEPBOUND) 2.5 MG/0.5ML injection vial [[id number]]  Pt stated that they had instructed to call the following number to initiate this process:  [phone]

## 2022-10-18 NOTE — Telephone Encounter (Signed)
Pt called requesting to have they [facility] on her pharmacy list to be changed to reflect the following:   [facility] [address] [place], [place] [address] P: [phone]

## 2022-10-18 NOTE — Patient Instructions (Signed)
Calorie Counting for Weight Loss Calories are units of energy. Your body needs a certain number of calories from food to keep going throughout the day. When you eat or drink more calories than your body needs, your body stores the extra calories mostly as fat. When you eat or drink fewer calories than your body needs, your body burns fat to get the energy it needs. Calorie counting means keeping track of how many calories you eat and drink each day. Calorie counting can be helpful if you need to lose weight. If you eat fewer calories than your body needs, you should lose weight. Ask your health care provider what a healthy weight is for you. For calorie counting to work, you will need to eat the right number of calories each day to lose a healthy amount of weight per week. A dietitian can help you figure out how many calories you need in a day and will suggest ways to reach your calorie goal. A healthy amount of weight to lose each week is usually 1-2 lb (0.5-0.9 kg). This usually means that your daily calorie intake should be reduced by 500-750 calories. Eating 1,200-1,500 calories a day can help most women lose weight. Eating 1,500-1,800 calories a day can help most men lose weight. What do I need to know about calorie counting? Work with your health care provider or dietitian to determine how many calories you should get each day. To meet your daily calorie goal, you will need to: Find out how many calories are in each food that you would like to eat. Try to do this before you eat. Decide how much of the food you plan to eat. Keep a food log. Do this by writing down what you ate and how many calories it had. To successfully lose weight, it is important to balance calorie counting with a healthy lifestyle that includes regular activity. Where do I find calorie information?  The number of calories in a food can be found on a Nutrition Facts label. If a food does not have a Nutrition Facts label, try  to look up the calories online or ask your dietitian for help. Remember that calories are listed per serving. If you choose to have more than one serving of a food, you will have to multiply the calories per serving by the number of servings you plan to eat. For example, the label on a package of bread might say that a serving size is 1 slice and that there are 90 calories in a serving. If you eat 1 slice, you will have eaten 90 calories. If you eat 2 slices, you will have eaten 180 calories. How do I keep a food log? After each time that you eat, record the following in your food log as soon as possible: What you ate. Be sure to include toppings, sauces, and other extras on the food. How much you ate. This can be measured in cups, ounces, or number of items. How many calories were in each food and drink. The total number of calories in the food you ate. Keep your food log near you, such as in a pocket-sized notebook or on an app or website on your mobile phone. Some programs will calculate calories for you and show you how many calories you have left to meet your daily goal. What are some portion-control tips? Know how many calories are in a serving. This will help you know how many servings you can have of a certain   food. Use a measuring cup to measure serving sizes. You could also try weighing out portions on a kitchen scale. With time, you will be able to estimate serving sizes for some foods. Take time to put servings of different foods on your favorite plates or in your favorite bowls and cups so you know what a serving looks like. Try not to eat straight from a food's packaging, such as from a bag or box. Eating straight from the package makes it hard to see how much you are eating and can lead to overeating. Put the amount you would like to eat in a cup or on a plate to make sure you are eating the right portion. Use smaller plates, glasses, and bowls for smaller portions and to prevent  overeating. Try not to multitask. For example, avoid watching TV or using your computer while eating. If it is time to eat, sit down at a table and enjoy your food. This will help you recognize when you are full. It will also help you be more mindful of what and how much you are eating. What are tips for following this plan? Reading food labels Check the calorie count compared with the serving size. The serving size may be smaller than what you are used to eating. Check the source of the calories. Try to choose foods that are high in protein, fiber, and vitamins, and low in saturated fat, trans fat, and sodium. Shopping Read nutrition labels while you shop. This will help you make healthy decisions about which foods to buy. Pay attention to nutrition labels for low-fat or fat-free foods. These foods sometimes have the same number of calories or more calories than the full-fat versions. They also often have added sugar, starch, or salt to make up for flavor that was removed with the fat. Make a grocery list of lower-calorie foods and stick to it. Cooking Try to cook your favorite foods in a healthier way. For example, try baking instead of frying. Use low-fat dairy products. Meal planning Use more fruits and vegetables. One-half of your plate should be fruits and vegetables. Include lean proteins, such as chicken, turkey, and fish. Lifestyle Each week, aim to do one of the following: 150 minutes of moderate exercise, such as walking. 75 minutes of vigorous exercise, such as running. General information Know how many calories are in the foods you eat most often. This will help you calculate calorie counts faster. Find a way of tracking calories that works for you. Get creative. Try different apps or programs if writing down calories does not work for you. What foods should I eat?  Eat nutritious foods. It is better to have a nutritious, high-calorie food, such as an avocado, than a food with  few nutrients, such as a bag of potato chips. Use your calories on foods and drinks that will fill you up and will not leave you hungry soon after eating. Examples of foods that fill you up are nuts and nut butters, vegetables, lean proteins, and high-fiber foods such as whole grains. High-fiber foods are foods with more than 5 g of fiber per serving. Pay attention to calories in drinks. Low-calorie drinks include water and unsweetened drinks. The items listed above may not be a complete list of foods and beverages you can eat. Contact a dietitian for more information. What foods should I limit? Limit foods or drinks that are not good sources of vitamins, minerals, or protein or that are high in unhealthy fats. These   include: Candy. Other sweets. Sodas, specialty coffee drinks, alcohol, and juice. The items listed above may not be a complete list of foods and beverages you should avoid. Contact a dietitian for more information. How do I count calories when eating out? Pay attention to portions. Often, portions are much larger when eating out. Try these tips to keep portions smaller: Consider sharing a meal instead of getting your own. If you get your own meal, eat only half of it. Before you start eating, ask for a container and put half of your meal into it. When available, consider ordering smaller portions from the menu instead of full portions. Pay attention to your food and drink choices. Knowing the way food is cooked and what is included with the meal can help you eat fewer calories. If calories are listed on the menu, choose the lower-calorie options. Choose dishes that include vegetables, fruits, whole grains, low-fat dairy products, and lean proteins. Choose items that are boiled, broiled, grilled, or steamed. Avoid items that are buttered, battered, fried, or served with cream sauce. Items labeled as crispy are usually fried, unless stated otherwise. Choose water, low-fat milk,  unsweetened iced tea, or other drinks without added sugar. If you want an alcoholic beverage, choose a lower-calorie option, such as a glass of wine or light beer. Ask for dressings, sauces, and syrups on the side. These are usually high in calories, so you should limit the amount you eat. If you want a salad, choose a garden salad and ask for grilled meats. Avoid extra toppings such as bacon, cheese, or fried items. Ask for the dressing on the side, or ask for olive oil and vinegar or lemon to use as dressing. Estimate how many servings of a food you are given. Knowing serving sizes will help you be aware of how much food you are eating at restaurants. Where to find more information Centers for Disease Control and Prevention: www.cdc.gov U.S. Department of Agriculture: myplate.gov Summary Calorie counting means keeping track of how many calories you eat and drink each day. If you eat fewer calories than your body needs, you should lose weight. A healthy amount of weight to lose per week is usually 1-2 lb (0.5-0.9 kg). This usually means reducing your daily calorie intake by 500-750 calories. The number of calories in a food can be found on a Nutrition Facts label. If a food does not have a Nutrition Facts label, try to look up the calories online or ask your dietitian for help. Use smaller plates, glasses, and bowls for smaller portions and to prevent overeating. Use your calories on foods and drinks that will fill you up and not leave you hungry shortly after a meal. This information is not intended to replace advice given to you by your health care provider. Make sure you discuss any questions you have with your health care provider. Document Revised: 02/05/2019 Document Reviewed: 02/05/2019 Elsevier Patient Education  2023 Elsevier Inc.  

## 2022-10-18 NOTE — Telephone Encounter (Signed)
Pharmacy updated.

## 2022-10-18 NOTE — Progress Notes (Signed)
Established Patient Office Visit  Subjective   Patient ID: Crystal Sandoval, female    DOB: [birth year]  Age: 47 y.o. MRN: [id number]  Chief Complaint  Patient presents with   Weight Loss    HPI Discussed the use of AI scribe software for clinical note transcription with the patient, who gave verbal consent to proceed.  History of Present Illness   The patient, with a history of prediabetes or insulin resistance, presents with a desire to start a weight loss medication. She recently started a job and obtained insurance through [place], with [employer] for prescriptions. She is interested in trying Zepbound, an injectable weight loss medication, and is unsure if her insurance will cover it. She has not previously been on any injectable weight loss medications.      Patient Active Problem List   Diagnosis Date Noted   Preventative health care 05/06/2020   Primary hypertension 05/29/2018   Prediabetes 03/11/2018   Morbid obesity (HCC) 02/19/2018   Lymphadenopathy 07/01/2017   Obesity (BMI 30.0-34.9) 07/18/2015   HLD (hyperlipidemia) 12/12/2013   Avitaminosis D 02/19/2013   Major depressive disorder 10/12/2011   Past Medical History:  Diagnosis Date   Allergy    Asthma    Constipation    Depression    Frequent headaches    GERD (gastroesophageal reflux disease)    High cholesterol    Hypertension    Kidney problem    Lactose intolerance    Migraine    Vitamin D deficiency    Past Surgical History:  Procedure Laterality Date   WISDOM TOOTH EXTRACTION     Social History   Tobacco Use   Smoking status: Never   Smokeless tobacco: Never  Substance Use Topics   Alcohol use: Yes    Alcohol/week: 0.0 standard drinks of alcohol    Comment: Occ   Drug use: Never   Social History   Socioeconomic History   Marital status: Married    Spouse name: Dorian Heckle   Number of children: Not on file   Years of education: Not on file   Highest education level: Not on file   Occupational History   Occupation: Editor, commissioning    Employer: [employer]    Comment: surgical tech  Tobacco Use   Smoking status: Never   Smokeless tobacco: Never  Substance and Sexual Activity   Alcohol use: Yes    Alcohol/week: 0.0 standard drinks of alcohol    Comment: Occ   Drug use: Never   Sexual activity: Yes  Other Topics Concern   Not on file  Social History Narrative   Not on file   Social Determinants of Health   Financial Resource Strain: Not on file  Food Insecurity: Not on file  Transportation Needs: Not on file  Physical Activity: Not on file  Stress: Not on file  Social Connections: Not on file  Intimate Partner Violence: Not on file   Family Status  Relation Name Status   Mother  Alive   Father  Alive   MGM  Deceased   PGM  Deceased   MGF  Deceased   PGF  Deceased  No partnership data on file   Family History  Problem Relation Age of Onset   Alcohol abuse Mother    Drug abuse Mother    High Cholesterol Mother    High blood pressure Father    High Cholesterol Father    Depression Father    Breast cancer Maternal Grandmother  Hypertension Maternal Grandmother    Breast cancer Paternal Grandmother    Hypertension Paternal Grandmother    Hypertension Maternal Grandfather    Hypertension Paternal Grandfather    Allergies  Allergen Reactions   Ibuprofen     Elevated kidney function      ROS    Objective:     BP 134/82 (BP Location: Left Arm, Patient Position: Sitting, Cuff Size: Normal)   Pulse 60   Temp (!) 97.5 F (36.4 C) (Oral)   Resp 18   Ht 5\' 2"  (1.575 m)   Wt 179 lb 12.8 oz (81.6 kg)   SpO2 99%   BMI 32.89 kg/m  BP Readings from Last 3 Encounters:  10/18/22 134/82  07/02/22 100/80  07/10/21 138/80   Wt Readings from Last 3 Encounters:  10/18/22 179 lb 12.8 oz (81.6 kg)  07/02/22 168 lb 6.4 oz (76.4 kg)  07/10/21 157 lb (71.2 kg)   SpO2 Readings from Last 3 Encounters:  10/18/22 99%  07/02/22 99%   07/10/21 99%      Physical Exam Vitals and nursing note reviewed.  Constitutional:      General: She is not in acute distress.    Appearance: Normal appearance. She is well-developed.  HENT:     Head: Normocephalic and atraumatic.  Eyes:     General: No scleral icterus.       Right eye: No discharge.        Left eye: No discharge.  Cardiovascular:     Rate and Rhythm: Normal rate and regular rhythm.     Heart sounds: No murmur heard. Pulmonary:     Effort: Pulmonary effort is normal. No respiratory distress.     Breath sounds: Normal breath sounds.  Musculoskeletal:        General: Normal range of motion.     Cervical back: Normal range of motion and neck supple.     Right lower leg: No edema.     Left lower leg: No edema.  Skin:    General: Skin is warm and dry.  Neurological:     Mental Status: She is alert and oriented to person, place, and time.  Psychiatric:        Mood and Affect: Mood normal.        Behavior: Behavior normal.        Thought Content: Thought content normal.        Judgment: Judgment normal.      No results found for any visits on 10/18/22.  Last CBC Lab Results  Component Value Date   WBC 4.1 07/02/2022   HGB 14.3 07/02/2022   HCT 43.3 07/02/2022   MCV 92.1 07/02/2022   MCH 30.2 02/18/2018   RDW 13.8 07/02/2022   PLT 294.0 07/02/2022   Last metabolic panel Lab Results  Component Value Date   GLUCOSE 80 07/02/2022   NA 137 07/02/2022   K 3.9 07/02/2022   CL 100 07/02/2022   CO2 30 07/02/2022   BUN 15 07/02/2022   CREATININE 1.41 (H) 07/02/2022   GFR 44.55 (L) 07/02/2022   CALCIUM 9.5 07/02/2022   PROT 7.3 07/02/2022   ALBUMIN 4.2 07/02/2022   LABGLOB 2.8 02/18/2018   AGRATIO 1.6 02/18/2018   BILITOT 1.1 07/02/2022   ALKPHOS 60 07/02/2022   AST 14 07/02/2022   ALT 11 07/02/2022   Last lipids Lab Results  Component Value Date   CHOL 250 (H) 07/02/2022   HDL 64.00 07/02/2022   LDLCALC 156 (H) 07/02/2022  TRIG 150.0  (H) 07/02/2022   CHOLHDL 4 07/02/2022   Last hemoglobin A1c Lab Results  Component Value Date   HGBA1C 5.7 07/02/2022   Last thyroid functions Lab Results  Component Value Date   TSH 0.94 03/22/2020   T3TOTAL 112 02/18/2018   Last vitamin D Lab Results  Component Value Date   VD25OH 29.81 (L) 03/22/2020   Last vitamin B12 and Folate Lab Results  Component Value Date   VITAMINB12 1,489 (H) 02/18/2018   FOLATE 9.2 02/18/2018      The 10-year ASCVD risk score (Arnett DK, et al., 2019) is: 3.2%    Assessment & Plan:   Problem List Items Addressed This Visit       Unprioritized   Morbid obesity (HCC) - Primary   Relevant Medications   tirzepatide (ZEPBOUND) 2.5 MG/0.5ML injection vial   Prediabetes   Relevant Medications   tirzepatide (ZEPBOUND) 2.5 MG/0.5ML injection vial   Other Visit Diagnoses     Insulin resistance       Relevant Medications   tirzepatide (ZEPBOUND) 2.5 MG/0.5ML injection vial     Assessment and Plan    Obesity Patient interested in starting weight loss medication, specifically Zepbound. Discussed the dosing, side effects, and insurance coverage. Patient has not previously been on injectable weight loss medications. -Start Zepbound, beginning with 2.5mg  dose and increasing by 2.5mg  monthly as tolerated and as allowed by insurance. -Check with pharmacy regarding insurance coverage and potential discount card. -Plan for follow-up in 3 months to assess weight loss progress and insurance requirements.  Prediabetes No recent changes. Last labs in June 2024. -Continue current management.        Return in about 3 months (around 01/18/2023), or if symptoms worsen or fail to improve, for weight loss.    Donato Schultz, DO

## 2022-10-24 ENCOUNTER — Telehealth: Payer: Self-pay

## 2022-10-24 ENCOUNTER — Other Ambulatory Visit (HOSPITAL_COMMUNITY): Payer: Self-pay

## 2022-10-24 NOTE — Telephone Encounter (Signed)
Pharmacy Patient Advocate Encounter   Received notification from Pt Calls Messages that prior authorization for Zepbound is required/requested.   Insurance verification completed.   The patient is insured through [facility]  .   Per test claim: PA required; PA submitted to [facility] via CoverMyMeds Key/confirmation #/EOC [id number] Status is pending

## 2022-10-24 NOTE — Telephone Encounter (Signed)
PA request has been Submitted. New Encounter created for follow up. For additional info see Pharmacy Prior Auth telephone encounter from 10/24/2022.

## 2022-10-24 NOTE — Telephone Encounter (Signed)
Pharmacy Patient Advocate Encounter  Received notification from [facility] that Prior Authorization for Zepbound 2.5mg  has been DENIED.  Full denial letter will be uploaded to the media tab. See denial reason below.    PA #/Case ID/Reference #: [id number]

## 2022-10-25 NOTE — Telephone Encounter (Signed)
Pt said that she has new prescription coverage through [employer] so the pa needs to be done through there. Their phone number #[phone]. Her [employer] id # is [id number]. Please call pt to provide status

## 2022-11-02 ENCOUNTER — Other Ambulatory Visit (HOSPITAL_COMMUNITY): Payer: Self-pay

## 2022-11-02 NOTE — Telephone Encounter (Addendum)
Pharmacy Patient Advocate Encounter   Received notification from Pt Calls Messages that prior authorization for Zepbound 2.5MG /0.5ML pen-injectors is required/requested.   Insurance verification completed.   The patient is insured through [facility] .   Per test claim: PA required; PA submitted to [facility] via CoverMyMeds Key/confirmation #/EOC [id number] Status is pending    PA resubmitted through new insurance.

## 2022-11-07 ENCOUNTER — Other Ambulatory Visit (HOSPITAL_COMMUNITY): Payer: Self-pay

## 2022-11-07 NOTE — Telephone Encounter (Signed)
Pharmacy Patient Advocate Encounter  Received notification from [facility] that Prior Authorization for Zepbound 2.5MG /0.5ML pen-injectors has been APPROVED from 11/06/22 to 05/28/23   PA #/Case ID/Reference #: [id number]   Please note that this medication must be filled through [facility]. Please call [facility] at [phone] for more information.

## 2022-12-12 ENCOUNTER — Other Ambulatory Visit: Payer: Self-pay | Admitting: Family Medicine

## 2022-12-12 ENCOUNTER — Other Ambulatory Visit (HOSPITAL_BASED_OUTPATIENT_CLINIC_OR_DEPARTMENT_OTHER): Payer: Self-pay

## 2022-12-12 ENCOUNTER — Other Ambulatory Visit: Payer: Self-pay

## 2022-12-12 DIAGNOSIS — F32A Depression, unspecified: Secondary | ICD-10-CM

## 2022-12-12 MED ORDER — FETZIMA 80 MG PO CP24
80.0000 mg | ORAL_CAPSULE | Freq: Every day | ORAL | 1 refills | Status: DC
Start: 2022-12-12 — End: 2023-07-15
  Filled 2022-12-12: qty 90, 90d supply, fill #0
  Filled 2023-03-04: qty 90, 90d supply, fill #1

## 2022-12-13 ENCOUNTER — Other Ambulatory Visit (HOSPITAL_BASED_OUTPATIENT_CLINIC_OR_DEPARTMENT_OTHER): Payer: Self-pay

## 2022-12-13 ENCOUNTER — Telehealth: Payer: Self-pay

## 2022-12-13 NOTE — Telephone Encounter (Signed)
PA initiated via Covermymeds; KEY: [id number]. Awaiting determination

## 2022-12-13 NOTE — Telephone Encounter (Signed)
PA approved.   Request Reference Number: [id number]. FETZIMA CAP 80MG  is approved through 12/13/2023. Your patient may now fill this prescription and it will be covered. Authorization Expiration Date: 12/13/2023

## 2022-12-14 ENCOUNTER — Other Ambulatory Visit (HOSPITAL_BASED_OUTPATIENT_CLINIC_OR_DEPARTMENT_OTHER): Payer: Self-pay

## 2023-01-03 ENCOUNTER — Other Ambulatory Visit (HOSPITAL_COMMUNITY): Payer: Self-pay

## 2023-01-18 ENCOUNTER — Ambulatory Visit: Payer: BC Managed Care – PPO | Admitting: Family Medicine

## 2023-03-04 ENCOUNTER — Other Ambulatory Visit: Payer: Self-pay

## 2023-03-04 ENCOUNTER — Other Ambulatory Visit (HOSPITAL_BASED_OUTPATIENT_CLINIC_OR_DEPARTMENT_OTHER): Payer: Self-pay

## 2023-03-04 MED ORDER — ERYTHROMYCIN 5 MG/GM OP OINT
TOPICAL_OINTMENT | OPHTHALMIC | 3 refills | Status: AC
  Filled 2023-03-04: qty 3.5, 3d supply, fill #0

## 2023-03-04 MED ORDER — NEOMYCIN-POLYMYXIN-DEXAMETH 3.5-10000-0.1 OP SUSP
OPHTHALMIC | 0 refills | Status: AC
  Filled 2023-03-04: qty 5, 14d supply, fill #0

## 2023-03-04 MED ORDER — TRIAZOLAM 0.25 MG PO TABS
0.2500 mg | ORAL_TABLET | ORAL | 0 refills | Status: AC
Start: 2023-03-04 — End: ?
  Filled 2023-03-04: qty 3, 2d supply, fill #0

## 2023-03-05 ENCOUNTER — Other Ambulatory Visit (HOSPITAL_BASED_OUTPATIENT_CLINIC_OR_DEPARTMENT_OTHER): Payer: Self-pay

## 2023-03-25 ENCOUNTER — Other Ambulatory Visit: Payer: Self-pay

## 2023-04-02 HISTORY — PX: BLEPHAROPLASTY: SUR158

## 2023-05-17 ENCOUNTER — Other Ambulatory Visit (HOSPITAL_COMMUNITY): Payer: Self-pay

## 2023-05-17 ENCOUNTER — Telehealth: Payer: Self-pay

## 2023-05-17 NOTE — Telephone Encounter (Signed)
 Pharmacy Patient Advocate Encounter   Received notification from Patient Pharmacy that prior authorization for Zepbound vial is required/requested.   Insurance verification completed.   The patient is insured through [employer] .  Original prescription was written for vial which is no longer available. Ran test claim for pen injectors and patient has a plan benefit exclusion.

## 2023-07-09 ENCOUNTER — Other Ambulatory Visit: Payer: Self-pay | Admitting: Family Medicine

## 2023-07-09 DIAGNOSIS — Z1231 Encounter for screening mammogram for malignant neoplasm of breast: Secondary | ICD-10-CM

## 2023-07-15 ENCOUNTER — Ambulatory Visit (INDEPENDENT_AMBULATORY_CARE_PROVIDER_SITE_OTHER): Admitting: Family Medicine

## 2023-07-15 ENCOUNTER — Other Ambulatory Visit (HOSPITAL_COMMUNITY): Payer: Self-pay

## 2023-07-15 ENCOUNTER — Ambulatory Visit
Admission: RE | Admit: 2023-07-15 | Discharge: 2023-07-15 | Disposition: A | Discharge status: Home / Self Care | Source: Ambulatory Visit | Attending: Family Medicine | Admitting: Family Medicine

## 2023-07-15 ENCOUNTER — Encounter: Payer: Self-pay | Admitting: Family Medicine

## 2023-07-15 ENCOUNTER — Other Ambulatory Visit (HOSPITAL_BASED_OUTPATIENT_CLINIC_OR_DEPARTMENT_OTHER): Payer: Self-pay

## 2023-07-15 VITALS — BP 118/86 | HR 72 | Temp 98.2°F | Resp 18 | Ht 62.0 in | Wt 185.4 lb

## 2023-07-15 DIAGNOSIS — I10 Essential (primary) hypertension: Secondary | ICD-10-CM | POA: Diagnosis not present

## 2023-07-15 DIAGNOSIS — R7303 Prediabetes: Secondary | ICD-10-CM

## 2023-07-15 DIAGNOSIS — E785 Hyperlipidemia, unspecified: Secondary | ICD-10-CM | POA: Diagnosis not present

## 2023-07-15 DIAGNOSIS — E88819 Insulin resistance, unspecified: Secondary | ICD-10-CM | POA: Diagnosis not present

## 2023-07-15 DIAGNOSIS — F320 Major depressive disorder, single episode, mild: Secondary | ICD-10-CM

## 2023-07-15 DIAGNOSIS — F329 Major depressive disorder, single episode, unspecified: Secondary | ICD-10-CM

## 2023-07-15 DIAGNOSIS — Z1211 Encounter for screening for malignant neoplasm of colon: Secondary | ICD-10-CM

## 2023-07-15 DIAGNOSIS — Z1231 Encounter for screening mammogram for malignant neoplasm of breast: Secondary | ICD-10-CM

## 2023-07-15 DIAGNOSIS — Z0000 Encounter for general adult medical examination without abnormal findings: Secondary | ICD-10-CM

## 2023-07-15 DIAGNOSIS — F32A Depression, unspecified: Secondary | ICD-10-CM

## 2023-07-15 DIAGNOSIS — Z6833 Body mass index (BMI) 33.0-33.9, adult: Secondary | ICD-10-CM

## 2023-07-15 LAB — CBC WITH DIFFERENTIAL/PLATELET
Basophils Absolute: 0 K/uL (ref 0.0–0.1)
Basophils Relative: 0.6 % (ref 0.0–3.0)
Eosinophils Absolute: 0.1 K/uL (ref 0.0–0.7)
Eosinophils Relative: 1.6 % (ref 0.0–5.0)
HCT: 40.8 % (ref 36.0–46.0)
Hemoglobin: 13.5 g/dL (ref 12.0–15.0)
Lymphocytes Relative: 53.9 % — ABNORMAL HIGH (ref 12.0–46.0)
Lymphs Abs: 2.2 K/uL (ref 0.7–4.0)
MCHC: 33.2 g/dL (ref 30.0–36.0)
MCV: 90.2 fl (ref 78.0–100.0)
Monocytes Absolute: 0.3 K/uL (ref 0.1–1.0)
Monocytes Relative: 7.4 % (ref 3.0–12.0)
Neutro Abs: 1.5 K/uL (ref 1.4–7.7)
Neutrophils Relative %: 36.5 % — ABNORMAL LOW (ref 43.0–77.0)
Platelets: 282 K/uL (ref 150.0–400.0)
RBC: 4.52 Mil/uL (ref 3.87–5.11)
RDW: 13.1 % (ref 11.5–15.5)
WBC: 4.1 K/uL (ref 4.0–10.5)

## 2023-07-15 LAB — HEMOGLOBIN A1C: Hgb A1c MFr Bld: 6.1 % (ref 4.6–6.5)

## 2023-07-15 LAB — COMPREHENSIVE METABOLIC PANEL WITH GFR
ALT: 15 U/L (ref 0–35)
AST: 17 U/L (ref 0–37)
Albumin: 4.2 g/dL (ref 3.5–5.2)
Alkaline Phosphatase: 65 U/L (ref 39–117)
BUN: 11 mg/dL (ref 6–23)
CO2: 29 meq/L (ref 19–32)
Calcium: 9.3 mg/dL (ref 8.4–10.5)
Chloride: 105 meq/L (ref 96–112)
Creatinine, Ser: 1.18 mg/dL (ref 0.40–1.20)
GFR: 54.77 mL/min — ABNORMAL LOW (ref >60.00)
Glucose, Bld: 96 mg/dL (ref 70–99)
Potassium: 4.2 meq/L (ref 3.5–5.1)
Sodium: 141 meq/L (ref 135–145)
Total Bilirubin: 0.7 mg/dL (ref 0.2–1.2)
Total Protein: 6.9 g/dL (ref 6.0–8.3)

## 2023-07-15 LAB — LIPID PANEL
Cholesterol: 254 mg/dL — ABNORMAL HIGH (ref 0–200)
HDL: 61.8 mg/dL (ref >39.00)
LDL Cholesterol: 157 mg/dL — ABNORMAL HIGH (ref 0–99)
NonHDL: 191.71
Total CHOL/HDL Ratio: 4
Triglycerides: 172 mg/dL — ABNORMAL HIGH (ref 0.0–149.0)
VLDL: 34.4 mg/dL (ref 0.0–40.0)

## 2023-07-15 LAB — MICROALBUMIN / CREATININE URINE RATIO
Creatinine,U: 254.1 mg/dL
Microalb Creat Ratio: 5.1 mg/g (ref 0.0–30.0)
Microalb, Ur: 1.3 mg/dL (ref 0.0–1.9)

## 2023-07-15 LAB — TSH: TSH: 1.8 u[IU]/mL (ref 0.35–5.50)

## 2023-07-15 MED ORDER — FETZIMA 80 MG PO CP24
80.0000 mg | ORAL_CAPSULE | Freq: Every day | ORAL | 3 refills | Status: AC
  Filled 2023-07-15: qty 90, 90d supply, fill #0

## 2023-07-15 MED ORDER — AMLODIPINE BESYLATE 5 MG PO TABS
5.0000 mg | ORAL_TABLET | Freq: Every day | ORAL | 3 refills | Status: AC
  Filled 2023-07-15 (×2): qty 90, 90d supply, fill #0

## 2023-07-15 MED ORDER — METOPROLOL SUCCINATE ER 100 MG PO TB24
100.0000 mg | ORAL_TABLET | Freq: Every day | ORAL | 3 refills | Status: AC
  Filled 2023-07-15: qty 90, 90d supply, fill #0

## 2023-07-15 MED ORDER — BUPROPION HCL ER (XL) 300 MG PO TB24
300.0000 mg | ORAL_TABLET | Freq: Every day | ORAL | 3 refills | Status: AC
  Filled 2023-07-15: qty 90, 90d supply, fill #0

## 2023-07-15 MED ORDER — HYDROCHLOROTHIAZIDE 25 MG PO TABS
25.0000 mg | ORAL_TABLET | Freq: Every day | ORAL | 3 refills | Status: AC
Start: 2023-07-15 — End: ?
  Filled 2023-07-15: qty 90, 90d supply, fill #0

## 2023-07-15 NOTE — Progress Notes (Signed)
 Established Patient Office Visit  Subjective   Patient ID: Crystal Sandoval, female    DOB: 12/10/1975  Age: 48 y.o. MRN: [id number]  Chief Complaint  Patient presents with   Annual Exam    Pt states fasting     HPI Discussed the use of AI scribe software for clinical note transcription with the patient, who gave verbal consent to proceed.  History of Present Illness The patient presents for an annual physical exam.  She underwent a lower lid blepharoplasty on April 02, 2023, performed by Dr. Melba, which she describes as having gone well. She was given Halcyon to take before the surgery.  She requests refills for several medications including metoprolol , metformin , hydrochlorothiazide , Fetzima , bupropion , and amlodipine . She does not require refills for Flonase  or albuterol  as she has an adequate supply.  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 She does not engage in regular exercise and is interested in checking her kidney function during the lab work.  No changes in her family history. No issues with her stomach or joints and no concerns about animals.   Patient Active Problem List   Diagnosis Date Noted   Preventative health care 05/06/2020   Primary hypertension 05/29/2018   Prediabetes 03/11/2018   Morbid obesity (HCC) 02/19/2018   Lymphadenopathy 07/01/2017   Obesity (BMI 30.0-34.9) 07/18/2015   HLD (hyperlipidemia) 12/12/2013   Avitaminosis D 02/19/2013   Major depressive disorder 10/12/2011   Past Medical History:  Diagnosis Date   Allergy    Asthma    Constipation    Depression    Frequent headaches    GERD (gastroesophageal reflux disease)    High cholesterol    Hypertension    Kidney problem    Lactose intolerance    Migraine    Vitamin D  deficiency    Past Surgical History:  Procedure Laterality Date   BLEPHAROPLASTY  Bilateral 04/02/2023   Dr Melba   WISDOM TOOTH EXTRACTION     Social History   Tobacco Use   Smoking status: Never   Smokeless tobacco: Never  Substance Use Topics   Alcohol use: Yes    Alcohol/week: 0.0 standard drinks of alcohol    Comment: Occ   Drug use: Never   Social History   Socioeconomic History   Marital status: Married    Spouse name: Koren Hope   Number of children: Not on file   Years of education: Not on file   Highest education level: Not on file  Occupational History   Occupation: Editor, commissioning    Employer: [employer]    Comment: surgical tech   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 [employer]  Tobacco Use   Smoking status: Never   Smokeless tobacco: Never  Substance and Sexual Activity   Alcohol use: Yes    Alcohol/week: 0.0 standard drinks of alcohol    Comment: Occ   Drug use: Never   Sexual activity: Yes  Other Topics Concern   Not on file  Social History Narrative   Exercise -- no   Social Drivers of Corporate investment banker Strain: Not on file  Food Insecurity: Not on file  Transportation Needs: Not on file  Physical Activity: Not on file  Stress: Not on file  Social Connections: Unknown (10/27/2022)   Received from [facility]   Social Network    Social Network: Not on file  Intimate Partner  Violence: Unknown (10/27/2022)   Received from [facility]   HITS    Physically Hurt: Not on file    Insult or Talk Down To: Not on file    Threaten Physical Harm: Not on file    Scream or Curse: Not on file   Family Status  Relation Name Status   Mother  Alive   Father  Alive   MGM  Deceased   PGM  Deceased   MGF  Deceased   PGF  Deceased  No partnership data on file   Family History  Problem Relation Age of Onset   Alcohol abuse Mother    Drug abuse Mother    High Cholesterol Mother    High blood pressure Father    High Cholesterol Father    Depression Father    Breast cancer Maternal Grandmother    Hypertension Maternal  Grandmother    Breast cancer Paternal Grandmother    Hypertension Paternal Grandmother    Hypertension Maternal Grandfather    Hypertension Paternal Grandfather    Allergies  Allergen Reactions   Ibuprofen     Elevated kidney function      Review of Systems  Constitutional:  Negative for chills, fever and malaise/fatigue.  HENT:  Negative for congestion and hearing loss.   Eyes:  Negative for blurred vision and discharge.  Respiratory:  Negative for cough, sputum production and shortness of breath.   Cardiovascular:  Negative for chest pain, palpitations and leg swelling.  Gastrointestinal:  Negative for abdominal pain, blood in stool, constipation, diarrhea, heartburn, nausea and vomiting.  Genitourinary:  Negative for dysuria, frequency, hematuria and urgency.  Musculoskeletal:  Negative for back pain, falls and myalgias.  Skin:  Negative for rash.  Neurological:  Negative for dizziness, sensory change, loss of consciousness, weakness and headaches.  Endo/Heme/Allergies:  Negative for environmental allergies. Does not bruise/bleed easily.  Psychiatric/Behavioral:  Negative for depression and suicidal ideas. The patient is not nervous/anxious and does not have insomnia.       Objective:     BP 118/86 (BP Location: Left Arm, Patient Position: Sitting, Cuff Size: Large)   Pulse 72   Temp 98.2 F (36.8 C) (Oral)   Resp 18   Ht 5' 2 (1.575 m)   Wt 185 lb 6.4 oz (84.1 kg)   SpO2 95%   BMI 33.91 kg/m  BP Readings from Last 3 Encounters:  07/15/23 118/86  10/18/22 134/82  07/02/22 100/80   Wt Readings from Last 3 Encounters:  07/15/23 185 lb 6.4 oz (84.1 kg)  10/18/22 179 lb 12.8 oz (81.6 kg)  07/02/22 168 lb 6.4 oz (76.4 kg)   SpO2 Readings from Last 3 Encounters:  07/15/23 95%  10/18/22 99%  07/02/22 99%      Physical Exam Vitals and nursing note reviewed.  Constitutional:      General: She is not in acute distress.    Appearance: Normal appearance. She is  well-developed.  HENT:     Head: Normocephalic and atraumatic.     Right Ear: Tympanic membrane, ear canal and external ear normal. There is no impacted cerumen.     Left Ear: Tympanic membrane, ear canal and external ear normal. There is no impacted cerumen.     Nose: Nose normal.     Mouth/Throat:     Mouth: Mucous membranes are moist.     Pharynx: Oropharynx is clear. No oropharyngeal exudate or posterior oropharyngeal erythema.  Eyes:     General: No scleral icterus.  Right eye: No discharge.        Left eye: No discharge.     Conjunctiva/sclera: Conjunctivae normal.     Pupils: Pupils are equal, round, and reactive to light.  Neck:     Thyroid : No thyromegaly or thyroid  tenderness.     Vascular: No JVD.  Cardiovascular:     Rate and Rhythm: Normal rate and regular rhythm.     Heart sounds: Normal heart sounds. No murmur heard. Pulmonary:     Effort: Pulmonary effort is normal. No respiratory distress.     Breath sounds: Normal breath sounds.  Abdominal:     General: Bowel sounds are normal. There is no distension.     Palpations: Abdomen is soft. There is no mass.     Tenderness: There is no abdominal tenderness. There is no guarding or rebound.  Genitourinary:    Vagina: Normal.  Musculoskeletal:        General: Normal range of motion.     Cervical back: Normal range of motion and neck supple.     Right lower leg: No edema.     Left lower leg: No edema.  Lymphadenopathy:     Cervical: No cervical adenopathy.  Skin:    General: Skin is warm and dry.     Findings: No erythema or rash.  Neurological:     Mental Status: She is alert and oriented to person, place, and time.     Cranial Nerves: No cranial nerve deficit.     Deep Tendon Reflexes: Reflexes are normal and symmetric.  Psychiatric:        Mood and Affect: Mood normal.        Behavior: Behavior normal.        Thought Content: Thought content normal.        Judgment: Judgment normal.      No results  found for any visits on 07/15/23.  Last CBC Lab Results  Component Value Date   WBC 4.1 07/02/2022   HGB 14.3 07/02/2022   HCT 43.3 07/02/2022   MCV 92.1 07/02/2022   MCH 30.2 02/18/2018   RDW 13.8 07/02/2022   PLT 294.0 07/02/2022   Last metabolic panel Lab Results  Component Value Date   GLUCOSE 80 07/02/2022   NA 137 07/02/2022   K 3.9 07/02/2022   CL 100 07/02/2022   CO2 30 07/02/2022   BUN 15 07/02/2022   CREATININE 1.41 (H) 07/02/2022   GFR 44.55 (L) 07/02/2022   CALCIUM 9.5 07/02/2022   PROT 7.3 07/02/2022   ALBUMIN 4.2 07/02/2022   LABGLOB 2.8 02/18/2018   AGRATIO 1.6 02/18/2018   BILITOT 1.1 07/02/2022   ALKPHOS 60 07/02/2022   AST 14 07/02/2022   ALT 11 07/02/2022   Last lipids Lab Results  Component Value Date   CHOL 250 (H) 07/02/2022   HDL 64.00 07/02/2022   LDLCALC 156 (H) 07/02/2022   TRIG 150.0 (H) 07/02/2022   CHOLHDL 4 07/02/2022   Last hemoglobin A1c Lab Results  Component Value Date   HGBA1C 5.7 07/02/2022   Last thyroid  functions Lab Results  Component Value Date   TSH 0.94 03/22/2020   T3TOTAL 112 02/18/2018   Last vitamin D  Lab Results  Component Value Date   VD25OH 29.81 (L) 03/22/2020   Last vitamin B12 and Folate Lab Results  Component Value Date   VITAMINB12 1,489 (H) 02/18/2018   FOLATE 9.2 02/18/2018      The 10-year ASCVD risk score (Arnett DK, et al., 2019)  is: 4.7%    Assessment & Plan:  Assessment and Plan Assessment & Plan Post-blepharoplasty Follow-up   She underwent a successful lower lid blepharoplasty on April 02, 2023, performed by Dr. Melba.  Medication Management   Refill prescriptions for metoprolol , metformin , hydrochlorothiazide , Fetzima , bupropion , and amlodipine . Flonase  and albuterol  refills are not needed at this time.  General Health Maintenance   She is overdue for a colonoscopy and wishes to proceed. Order routine labs, including kidney function tests. Arrange for [facility] to contact  her for colonoscopy scheduling.   +   Problem List Items Addressed This Visit       Unprioritized   Primary hypertension   Relevant Medications   amLODipine  (NORVASC ) 5 MG tablet   hydrochlorothiazide  (HYDRODIURIL ) 25 MG tablet   metoprolol  succinate (TOPROL -XL) 100 MG 24 hr tablet   Other Relevant Orders   Lipid panel   CBC with Differential/Platelet   Comprehensive metabolic panel with GFR   Microalbumin / creatinine urine ratio   TSH   Preventative health care - Primary   Ghm utd Check labs  See AVS Health Maintenance  Topic Date Due   Pneumococcal Vaccine 52-67 Years old (1 of 2 - PCV) Never done   Hepatitis B Vaccines (1 of 3 - 19+ 3-dose series) Never done   Colonoscopy  Never done   MAMMOGRAM  11/09/2021   INFLUENZA VACCINE  08/09/2023   Cervical Cancer Screening (HPV/Pap Cotest)  11/24/2023   DTaP/Tdap/Td (3 - Td or Tdap) 07/02/2027   Hepatitis C Screening  Completed   HIV Screening  Completed   HPV VACCINES  Aged Out   Meningococcal B Vaccine  Aged Out   COVID-19 Vaccine  Discontinued         Relevant Orders   Lipid panel   CBC with Differential/Platelet   Comprehensive metabolic panel with GFR   Hemoglobin A1c   Microalbumin / creatinine urine ratio   TSH   Insulin , random   Prediabetes   Check labs  hgba1c acceptable, minimize simple carbs. Increase exercise as tolerated. Continue current meds       Relevant Orders   Hemoglobin A1c   TSH   Insulin , random   Morbid obesity (HCC)   D/w pt diet and exercise      Relevant Orders   TSH   Insulin , random   Major depressive disorder   Relevant Medications   buPROPion  (WELLBUTRIN  XL) 300 MG 24 hr tablet   FETZIMA  80 MG CP24   HLD (hyperlipidemia)   Encourage heart healthy diet such as MIND or DASH diet, increase exercise, avoid trans fats, simple carbohydrates and processed foods, consider a krill or fish or flaxseed oil cap daily.        Relevant Medications   amLODipine  (NORVASC ) 5 MG  tablet   hydrochlorothiazide  (HYDRODIURIL ) 25 MG tablet   metoprolol  succinate (TOPROL -XL) 100 MG 24 hr tablet   Other Relevant Orders   Lipid panel   CBC with Differential/Platelet   Comprehensive metabolic panel with GFR   Microalbumin / creatinine urine ratio   TSH   Other Visit Diagnoses       Insulin  resistance       Relevant Orders   TSH   Insulin , random     Depression, major, single episode, mild (HCC)       Relevant Medications   buPROPion  (WELLBUTRIN  XL) 300 MG 24 hr tablet   FETZIMA  80 MG CP24     Depression, unspecified depression type  Relevant Medications   buPROPion  (WELLBUTRIN  XL) 300 MG 24 hr tablet   FETZIMA  80 MG CP24     Colon cancer screening       Relevant Orders   Ambulatory referral to Gastroenterology       No follow-ups on file.    Alverto Shedd R Lowne Chase, DO

## 2023-07-15 NOTE — Assessment & Plan Note (Signed)
 Ghm utd Check labs  See AVS Health Maintenance  Topic Date Due   Pneumococcal Vaccine 31-48 Years old (1 of 2 - PCV) Never done   Hepatitis B Vaccines (1 of 3 - 19+ 3-dose series) Never done   Colonoscopy  Never done   MAMMOGRAM  11/09/2021   INFLUENZA VACCINE  08/09/2023   Cervical Cancer Screening (HPV/Pap Cotest)  11/24/2023   DTaP/Tdap/Td (3 - Td or Tdap) 07/02/2027   Hepatitis C Screening  Completed   HIV Screening  Completed   HPV VACCINES  Aged Out   Meningococcal B Vaccine  Aged Out   COVID-19 Vaccine  Discontinued

## 2023-07-15 NOTE — Assessment & Plan Note (Signed)
D/w pt diet and exercise  

## 2023-07-15 NOTE — Assessment & Plan Note (Deleted)
 Stable Con't meds per psych

## 2023-07-15 NOTE — Assessment & Plan Note (Signed)
 Encourage heart healthy diet such as MIND or DASH diet, increase exercise, avoid trans fats, simple carbohydrates and processed foods, consider a krill or fish or flaxseed oil cap daily.

## 2023-07-15 NOTE — Assessment & Plan Note (Signed)
Check labs  hgba1c acceptable, minimize simple carbs. Increase exercise as tolerated. Continue current meds 

## 2023-07-16 ENCOUNTER — Ambulatory Visit: Payer: Self-pay | Admitting: Family Medicine

## 2023-07-16 ENCOUNTER — Other Ambulatory Visit (HOSPITAL_BASED_OUTPATIENT_CLINIC_OR_DEPARTMENT_OTHER): Payer: Self-pay

## 2023-07-16 DIAGNOSIS — Z01419 Encounter for gynecological examination (general) (routine) without abnormal findings: Secondary | ICD-10-CM | POA: Diagnosis not present

## 2023-07-16 DIAGNOSIS — Z6834 Body mass index (BMI) 34.0-34.9, adult: Secondary | ICD-10-CM | POA: Diagnosis not present

## 2023-07-16 DIAGNOSIS — Z124 Encounter for screening for malignant neoplasm of cervix: Secondary | ICD-10-CM | POA: Diagnosis not present

## 2023-07-16 DIAGNOSIS — Z1151 Encounter for screening for human papillomavirus (HPV): Secondary | ICD-10-CM | POA: Diagnosis not present

## 2023-07-16 LAB — INSULIN, RANDOM: Insulin: 13.5 u[IU]/mL

## 2023-07-16 MED ORDER — VALACYCLOVIR HCL 1 G PO TABS
1000.0000 mg | ORAL_TABLET | Freq: Every day | ORAL | 0 refills | Status: AC
  Filled 2023-07-16: qty 30, 30d supply, fill #0

## 2023-07-17 ENCOUNTER — Other Ambulatory Visit (HOSPITAL_BASED_OUTPATIENT_CLINIC_OR_DEPARTMENT_OTHER): Payer: Self-pay

## 2023-09-08 ENCOUNTER — Telehealth: Admitting: Family

## 2023-09-08 DIAGNOSIS — R399 Unspecified symptoms and signs involving the genitourinary system: Secondary | ICD-10-CM | POA: Diagnosis not present

## 2023-09-08 MED ORDER — CEPHALEXIN 500 MG PO CAPS
500.0000 mg | ORAL_CAPSULE | Freq: Two times a day (BID) | ORAL | 0 refills | Status: AC
Start: 2023-09-08 — End: ?

## 2023-09-08 NOTE — Progress Notes (Signed)

## 2023-10-09 ENCOUNTER — Encounter: Payer: Self-pay | Admitting: Family Medicine
